# Patient Record
Sex: Female | Born: 1975 | Race: Black or African American | Hispanic: No | Smoking: Never smoker
Health system: Southern US, Community
[De-identification: ages and names within clinical notes are randomized; demographics above are authoritative.]

## PROBLEM LIST (undated history)

## (undated) DIAGNOSIS — J45909 Unspecified asthma, uncomplicated: Secondary | ICD-10-CM

## (undated) DIAGNOSIS — M1990 Unspecified osteoarthritis, unspecified site: Secondary | ICD-10-CM

## (undated) DIAGNOSIS — Z8739 Personal history of other diseases of the musculoskeletal system and connective tissue: Secondary | ICD-10-CM

## (undated) HISTORY — PX: WRIST SURGERY: SHX841

## (undated) HISTORY — PX: ABDOMINAL HYSTERECTOMY: SHX81

---

## 2003-04-12 ENCOUNTER — Emergency Department (HOSPITAL_COMMUNITY): Admission: EM | Admit: 2003-04-12 | Discharge: 2003-04-12 | Payer: Self-pay | Admitting: Emergency Medicine

## 2007-05-26 ENCOUNTER — Emergency Department (HOSPITAL_COMMUNITY): Admission: EM | Admit: 2007-05-26 | Discharge: 2007-05-26 | Payer: Self-pay | Admitting: Emergency Medicine

## 2007-07-27 ENCOUNTER — Emergency Department (HOSPITAL_COMMUNITY): Admission: EM | Admit: 2007-07-27 | Discharge: 2007-07-27 | Payer: Self-pay | Admitting: Family Medicine

## 2007-09-18 ENCOUNTER — Emergency Department (HOSPITAL_COMMUNITY): Admission: EM | Admit: 2007-09-18 | Discharge: 2007-09-19 | Payer: Self-pay | Admitting: Emergency Medicine

## 2010-03-10 ENCOUNTER — Encounter: Payer: Self-pay | Admitting: Obstetrics & Gynecology

## 2010-11-14 LAB — POCT PREGNANCY, URINE
Operator id: 247071
Preg Test, Ur: NEGATIVE

## 2014-01-11 DIAGNOSIS — N809 Endometriosis, unspecified: Secondary | ICD-10-CM | POA: Insufficient documentation

## 2014-01-11 DIAGNOSIS — M545 Low back pain, unspecified: Secondary | ICD-10-CM | POA: Insufficient documentation

## 2014-01-11 DIAGNOSIS — J4520 Mild intermittent asthma, uncomplicated: Secondary | ICD-10-CM | POA: Insufficient documentation

## 2014-01-11 DIAGNOSIS — N920 Excessive and frequent menstruation with regular cycle: Secondary | ICD-10-CM | POA: Insufficient documentation

## 2014-01-11 DIAGNOSIS — J3089 Other allergic rhinitis: Secondary | ICD-10-CM | POA: Insufficient documentation

## 2014-01-11 DIAGNOSIS — M0520 Rheumatoid vasculitis with rheumatoid arthritis of unspecified site: Secondary | ICD-10-CM | POA: Insufficient documentation

## 2014-01-11 DIAGNOSIS — K219 Gastro-esophageal reflux disease without esophagitis: Secondary | ICD-10-CM | POA: Insufficient documentation

## 2014-10-06 ENCOUNTER — Encounter (HOSPITAL_COMMUNITY): Payer: Self-pay | Admitting: Emergency Medicine

## 2014-10-06 ENCOUNTER — Emergency Department (HOSPITAL_COMMUNITY)
Admission: EM | Admit: 2014-10-06 | Discharge: 2014-10-07 | Disposition: A | Discharge status: Home / Self Care | Payer: Managed Care, Other (non HMO) | Attending: Emergency Medicine | Admitting: Emergency Medicine

## 2014-10-06 DIAGNOSIS — R42 Dizziness and giddiness: Secondary | ICD-10-CM | POA: Insufficient documentation

## 2014-10-06 DIAGNOSIS — R112 Nausea with vomiting, unspecified: Secondary | ICD-10-CM | POA: Diagnosis not present

## 2014-10-06 DIAGNOSIS — Z7951 Long term (current) use of inhaled steroids: Secondary | ICD-10-CM | POA: Insufficient documentation

## 2014-10-06 DIAGNOSIS — R197 Diarrhea, unspecified: Secondary | ICD-10-CM | POA: Insufficient documentation

## 2014-10-06 DIAGNOSIS — M1990 Unspecified osteoarthritis, unspecified site: Secondary | ICD-10-CM | POA: Insufficient documentation

## 2014-10-06 DIAGNOSIS — Z79899 Other long term (current) drug therapy: Secondary | ICD-10-CM | POA: Diagnosis not present

## 2014-10-06 DIAGNOSIS — J45909 Unspecified asthma, uncomplicated: Secondary | ICD-10-CM | POA: Insufficient documentation

## 2014-10-06 HISTORY — DX: Unspecified asthma, uncomplicated: J45.909

## 2014-10-06 HISTORY — DX: Personal history of other diseases of the musculoskeletal system and connective tissue: Z87.39

## 2014-10-06 HISTORY — DX: Unspecified osteoarthritis, unspecified site: M19.90

## 2014-10-06 LAB — CBC
HEMATOCRIT: 39.2 % (ref 36.0–46.0)
HEMOGLOBIN: 13.1 g/dL (ref 12.0–15.0)
MCH: 28.4 pg (ref 26.0–34.0)
MCHC: 33.4 g/dL (ref 30.0–36.0)
MCV: 85 fL (ref 78.0–100.0)
Platelets: 566 10*3/uL — ABNORMAL HIGH (ref 150–400)
RBC: 4.61 MIL/uL (ref 3.87–5.11)
RDW: 14.7 % (ref 11.5–15.5)
WBC: 9.1 10*3/uL (ref 4.0–10.5)

## 2014-10-06 MED ORDER — ONDANSETRON 4 MG PO TBDP
4.0000 mg | ORAL_TABLET | Freq: Once | ORAL | Status: AC | PRN
  Administered 2014-10-06: 4 mg via ORAL
  Filled 2014-10-06: qty 1

## 2014-10-06 NOTE — ED Notes (Addendum)
Pt from home c/o dizziness and nausea yesterday morning. She reports stopping all her home meds in hopes that was the cause of her dizziness. She reports the dizziness "feels as is the room is spinning". Pt ambulated to triage with cane and steady gait. She reports taking pepto bismal witout relief.

## 2014-10-07 ENCOUNTER — Emergency Department (HOSPITAL_COMMUNITY): Payer: Managed Care, Other (non HMO)

## 2014-10-07 DIAGNOSIS — R112 Nausea with vomiting, unspecified: Secondary | ICD-10-CM | POA: Diagnosis not present

## 2014-10-07 LAB — COMPREHENSIVE METABOLIC PANEL
ALT: 18 U/L (ref 14–54)
ANION GAP: 9 (ref 5–15)
AST: 22 U/L (ref 15–41)
Albumin: 4.4 g/dL (ref 3.5–5.0)
Alkaline Phosphatase: 58 U/L (ref 38–126)
BUN: 8 mg/dL (ref 6–20)
CHLORIDE: 99 mmol/L — AB (ref 101–111)
CO2: 29 mmol/L (ref 22–32)
Calcium: 9.7 mg/dL (ref 8.9–10.3)
Creatinine, Ser: 0.97 mg/dL (ref 0.44–1.00)
GFR calc non Af Amer: >60 mL/min (ref >60)
Glucose, Bld: 89 mg/dL (ref 65–99)
POTASSIUM: 3.8 mmol/L (ref 3.5–5.1)
SODIUM: 137 mmol/L (ref 135–145)
Total Bilirubin: 0.5 mg/dL (ref 0.3–1.2)
Total Protein: 8.6 g/dL — ABNORMAL HIGH (ref 6.5–8.1)

## 2014-10-07 LAB — LIPASE, BLOOD: Lipase: 33 U/L (ref 22–51)

## 2014-10-07 MED ORDER — ONDANSETRON 4 MG PO TBDP: 4.0000 mg | ORAL_TABLET | Freq: Once | ORAL | Status: DC | PRN

## 2014-10-07 MED ORDER — SODIUM CHLORIDE 0.9 % IV BOLUS (SEPSIS)
1000.0000 mL | Freq: Once | INTRAVENOUS | Status: AC
  Administered 2014-10-07: 1000 mL via INTRAVENOUS

## 2014-10-07 NOTE — ED Notes (Signed)
Pt is unable to urinate at this time. 

## 2014-10-07 NOTE — Discharge Instructions (Signed)
follow up with your PCP  Try to eat a more bland diet for the next several days

## 2014-10-07 NOTE — ED Provider Notes (Addendum)
CSN: [id number]     Arrival date & time 10/06/14  2250 History   First MD Initiated Contact with Patient 10/07/14 0132     Chief Complaint  Patient presents with  . Dizziness  . Nausea     (Consider location/radiation/quality/duration/timing/severity/associated sxs/prior Treatment) Patient is a 39 y.o. female presenting with dizziness. The history is provided by the patient.  Dizziness Quality:  Head spinning Severity:  Moderate Onset quality:  Gradual Timing:  Constant Progression:  Unchanged Chronicity:  New Context: head movement   Relieved by:  Nothing Worsened by:  Nothing Ineffective treatments:  None tried Associated symptoms: diarrhea, nausea and vomiting   Associated symptoms: no chest pain, no headaches and no shortness of breath     Past Medical History  Diagnosis Date  . Asthma   . Arthritis   . H/O degenerative disc disease    Past Surgical History  Procedure Laterality Date  . Wrist surgery    . Abdominal hysterectomy     No family history on file. Social History  Substance Use Topics  . Smoking status: Never Smoker   . Smokeless tobacco: None  . Alcohol Use: Yes     Comment: social   OB History    No data available     Review of Systems  Constitutional: Negative for fever and chills.  Respiratory: Negative for shortness of breath.   Cardiovascular: Negative for chest pain.  Gastrointestinal: Positive for nausea, vomiting and diarrhea. Negative for abdominal pain.  Genitourinary: Negative for dysuria.  Skin: Negative for rash and wound.  Neurological: Positive for dizziness and light-headedness. Negative for headaches.  All other systems reviewed and are negative.     Allergies  Other and Percocet  Home Medications   Prior to Admission medications   Medication Sig Start Date End Date Taking? Authorizing Provider  albuterol (PROVENTIL HFA;VENTOLIN HFA) 108 (90 BASE) MCG/ACT inhaler Inhale 1-2 puffs into the lungs every 6 (six) hours  as needed for wheezing or shortness of breath.   Yes Historical Provider, MD  cetirizine (ZYRTEC) 10 MG tablet Take 10 mg by mouth daily.   Yes Historical Provider, MD  clonazePAM (KLONOPIN) 0.5 MG tablet Take 1 tablet by mouth daily as needed. anxiety 08/15/14  Yes Historical Provider, MD  diphenhydrAMINE (BENADRYL) 25 MG tablet Take 50 mg by mouth every 6 (six) hours as needed for itching (when she takes oxycodone).   Yes Historical Provider, MD  EPINEPHrine 0.3 mg/0.3 mL IJ SOAJ injection Inject 0.3 mg into the muscle once.   Yes Historical Provider, MD  fluticasone (FLONASE) 50 MCG/ACT nasal spray Place 1 spray into both nostrils 2 (two) times daily.   Yes Historical Provider, MD  ibuprofen (ADVIL,MOTRIN) 800 MG tablet Take 1 tablet by mouth every 8 (eight) hours as needed. pain 08/13/14  Yes Historical Provider, MD  pantoprazole (PROTONIX) 40 MG tablet Take 1 tablet by mouth daily. 09/23/14  Yes Historical Provider, MD  PARoxetine (PAXIL) 20 MG tablet Take 1 tablet by mouth daily. 08/15/14  Yes Historical Provider, MD  gabapentin (NEURONTIN) 300 MG capsule Take 1 capsule by mouth 3 (three) times daily. 09/22/14   Historical Provider, MD  ondansetron (ZOFRAN-ODT) 4 MG disintegrating tablet Take 1 tablet (4 mg total) by mouth once as needed for nausea. 10/07/14   Earley Favor, NP  oxyCODONE-acetaminophen (PERCOCET/ROXICET) 5-325 MG per tablet Take 1 tablet by mouth every 6 (six) hours as needed. pain 10/04/14   Historical Provider, MD  QVAR 80 MCG/ACT inhaler Inhale  2 puffs into the lungs 2 (two) times daily. 08/16/14   Historical Provider, MD   BP 123/84 mmHg  Pulse 92  Temp(Src) 98.3 F (36.8 C) (Oral)  Resp 20  SpO2 100%  LMP 10/06/2014 Physical Exam  Constitutional: She is oriented to person, place, and time. She appears well-developed.  HENT:  Head: Normocephalic.  Eyes: Pupils are equal, round, and reactive to light.  Neck: Normal range of motion.  Cardiovascular: Normal rate and regular  rhythm.   Pulmonary/Chest: Effort normal.  Abdominal: Soft.  Musculoskeletal: Normal range of motion.  Neurological: She is alert and oriented to person, place, and time.  Skin: Skin is warm.  Vitals reviewed.   ED Course  Procedures (including critical care time) Labs Review Labs Reviewed  COMPREHENSIVE METABOLIC PANEL - Abnormal; Notable for the following:    Chloride 99 (*)    Total Protein 8.6 (*)    All other components within normal limits  CBC - Abnormal; Notable for the following:    Platelets 566 (*)    All other components within normal limits  LIPASE, BLOOD  URINALYSIS, ROUTINE W REFLEX MICROSCOPIC (NOT AT [facility])    Imaging Review Ct Head Wo Contrast  10/07/2014   CLINICAL DATA:  Dizziness and nausea beginning 1 day ago. History of asthma.  EXAM: CT HEAD WITHOUT CONTRAST  TECHNIQUE: Contiguous axial images were obtained from the base of the skull through the vertex without intravenous contrast.  COMPARISON:  None.  FINDINGS: The ventricles and sulci are normal. No intraparenchymal hemorrhage, mass effect nor midline shift. No acute large vascular territory infarcts. Cerebellar tonsils at but not below the foramen magnum.  No abnormal extra-axial fluid collections. Basal cisterns are patent.  No skull fracture. The included ocular globes and orbital contents are non-suspicious. Fluid-filled, mildly expanded sella. The mastoid aircells and included paranasal sinuses are well-aerated.  IMPRESSION: No acute intracranial process.  CT findings of empty sella.   Electronically Signed   By: Awilda Metro M.D.   On: 10/07/2014 02:55   I have personally reviewed and evaluated these images and lab results as part of my medical decision-making.   EKG Interpretation None      MDM   Final diagnoses:  Nausea vomiting and diarrhea  Dizzy         Earley Favor, NP 10/07/14 0435  Cy Blamer, MD 10/07/14 0441  Earley Favor, NP 11/01/14 2001  April Palumbo,  MD 11/01/14 2309

## 2014-10-07 NOTE — ED Notes (Signed)
Pt able to ambulate without assistance or difficulty. Pt with steady gait. No complaints of dizziness or lightheadedness.

## 2014-10-20 ENCOUNTER — Other Ambulatory Visit: Payer: Self-pay | Admitting: Specialist

## 2014-10-20 DIAGNOSIS — M5416 Radiculopathy, lumbar region: Secondary | ICD-10-CM

## 2014-10-24 ENCOUNTER — Other Ambulatory Visit: Payer: Self-pay | Admitting: Specialist

## 2014-10-24 ENCOUNTER — Ambulatory Visit
Admission: RE | Admit: 2014-10-24 | Discharge: 2014-10-24 | Disposition: A | Discharge status: Home / Self Care | Payer: Managed Care, Other (non HMO) | Source: Ambulatory Visit | Attending: Specialist | Admitting: Specialist

## 2014-10-24 ENCOUNTER — Ambulatory Visit
Admission: RE | Admit: 2014-10-24 | Discharge: 2014-10-24 | Disposition: A | Discharge status: Home / Self Care | Source: Ambulatory Visit | Attending: Specialist | Admitting: Specialist

## 2014-10-24 ENCOUNTER — Inpatient Hospital Stay
Admission: RE | Admit: 2014-10-24 | Discharge: 2014-10-24 | Disposition: A | Discharge status: Home / Self Care | Payer: Self-pay | Source: Ambulatory Visit | Attending: Specialist | Admitting: Specialist

## 2014-10-24 DIAGNOSIS — M5416 Radiculopathy, lumbar region: Secondary | ICD-10-CM

## 2014-10-24 DIAGNOSIS — R52 Pain, unspecified: Secondary | ICD-10-CM

## 2014-10-24 MED ORDER — IOHEXOL 180 MG/ML  SOLN
15.0000 mL | Freq: Once | INTRAMUSCULAR | Status: DC | PRN
  Administered 2014-10-24: 15 mL via INTRATHECAL

## 2014-10-24 MED ORDER — DIAZEPAM 5 MG PO TABS
10.0000 mg | ORAL_TABLET | Freq: Once | ORAL | Status: AC
  Administered 2014-10-24: 10 mg via ORAL

## 2014-10-24 NOTE — Discharge Instructions (Signed)
Myelogram Discharge Instructions  1. Go home and rest quietly for the next 24 hours.  It is important to lie flat for the next 24 hours.  Get up only to go to the restroom.  You may lie in the bed or on a couch on your back, your stomach, your left side or your right side.  You may have one pillow under your head.  You may have pillows between your knees while you are on your side or under your knees while you are on your back.  2. DO NOT drive today.  Recline the seat as far back as it will go, while still wearing your seat belt, on the way home.  3. You may get up to go to the bathroom as needed.  You may sit up for 10 minutes to eat.  You may resume your normal diet and medications unless otherwise indicated.  Drink plenty of extra fluids today and tomorrow.  4. The incidence of a spinal headache with nausea and/or vomiting is about 5% (one in 20 patients).  If you develop a headache, lie flat and drink plenty of fluids until the headache goes away.  Caffeinated beverages may be helpful.  If you develop severe nausea and vomiting or a headache that does not go away with flat bed rest, call [phone].  5. You may resume normal activities after your 24 hours of bed rest is over; however, do not exert yourself strongly or do any heavy lifting tomorrow.  6. Call your physician for a follow-up appointment.   You may resume Paroxetine/Paxil on Wednesday, October 25, 2014 after 1:00p.m.

## 2015-05-10 NOTE — H&P (Signed)
History and Physical    [facility]  Lexia Vandevender G. Jovane Foutz, MD  Admission Date: 05/10/2015    REASON FOR HOSPITALIZATION:  Symptomatic fibroid uterus for  hysterectomy.    HISTORY OF PRESENT ILLNESS:  This patient is a 40 year old black  female with a history for a symptomatic fibroid uterus which has been  unresponsive to medical therapy.  She is being admitted for  laparoscopic-assisted vaginal hysterectomy for definitive treatment.    PAST MEDICAL HISTORY:  Some arthritis, seasonal allergies and eczema.    PAST SURGICAL HISTORY:  Significant for previous cesarean section x1.   She has had a normal Pap smear, but did have positive high risk HPV,  so she is advised to have a complete hysterectomy.    ALLERGIES:  She has no known allergies.    MEDICATIONS:  She is on multiple medications which are listed in her  chart.    PHYSICAL EXAMINATION:  VITAL SIGNS:  She is afebrile with stable vital  signs.  GENERAL:  She is alert and oriented.  HEENT:  Unremarkable.   CHEST:  Clear to auscultation.  HEART:   Regular rate and rhythm.   ABDOMEN:  Soft and nontender.  PELVIC:  Reveals normal external  genitalia with normal vagina and cervix.  Bimanual exam confirms a  small fibroid uterus and no adnexal masses noted.  EXTREMITIES:   Unremarkable.    IMPRESSION:  Symptomatic fibroid uterus.      PLAN:  Laparoscopic-assisted vaginal hysterectomy.      Reece Packerebecca G Yasmina [place], MD  TR: *n DD: 05/10/2015 08:10 TD: 05/10/2015 08:27 Job#: [id number]  \\[id number]\\DOC#: [id number]  \\[id number]\\  Signature Line    Electronically Signed on 05/10/2015 04:45 PM EDT  ________________________________________________  Lissa MoralesBAIRD-MD,  Lawayne Hartig G

## 2015-05-10 NOTE — Procedures (Signed)
IntraOp Record - [facility]             IntraOp Record - [facility] Summary                                                                   Primary Physician:        Lissa Morales    Case Number:              [id number]    Finalized Date/Time:      05/10/15 10:21:21    Pt. Name:                 Kayla Lucas, Kayla Lucas    D.O.B./Sex:               09/21/1975    Female    Med Rec #:                [id number]    Physician:                Lissa Morales    Financial #:              [id number]    Pt. Type:                 R    Room/Bed:                 /    Admit/Disch:              05/10/15 05:43:00 -    Institution:       [facility] - Case Times                                                                                                         Entry 1                                                                                                          Patient      In Room Time             05/10/15 08:10:00               Out Room Time                   05/10/15 10:09:00    Anesthesia     Procedure  Start Time               05/10/15 08:32:00               Stop Time                       05/10/15 10:02:00    Last Modified By:         Billey Chang RN,                              PATRICIA 05/10/15                              10:13:02      SFOR - Case Times Audit                                                                          05/10/15 10:13:02         Owner: Clovis Fredrickson                               Modifier: NETHPA                                                        <+> 1         Out Room Time        <+> 1         Stop Time        SFOR - Safety Checklist - Sign In                                                               Pre-Care Text:            A.10 Confirms patient identity A.20 Verifies operative procedure, surgical site, and laterality A.20.1 Verifies           consent for planned procedure A.30 Verifies allergies                              Entry 1  History/Physical on       Yes                             Procedure Consent               Yes    Chart                                                     on Chart     Site Marked (if           Yes    applicable)     Last Modified By:         Billey Chang, RN,                              PATRICIA 05/10/15                              09:13:28    Post-Care Text:            E.30 Evaluates verification process for correct patient, site, side, and level surgery      SFOR - Case Attendance                                                                                                    Entry 1                         Entry 2                         Entry 3                                          Case Attendee             BAIRD-MD,  REBECCA Durwin Nora,  STEPHEN O    Role Performed            Surgeon Primary                 Surgeon Secondary               Anesthesiologist    Time In                   05/10/15 08:10:00               05/10/15 08:10:00               05/10/15 08:10:00  Time Out                  05/10/15 10:09:00               05/10/15 10:09:00               05/10/15 10:09:00    Procedure                 Hysterectomy Vaginal            Hysterectomy Vaginal            Hysterectomy Vaginal                              Laparoscopic Assist             Laparoscopic Assist             Laparoscopic Assist    Last Modified By:         Billey Chang RN,                 Billey Chang, RN,                 NETHERCOTT, RN,                              PATRICIA 05/10/15               PATRICIA 05/10/15               PATRICIA 05/10/15                              10:21:14                        10:21:14                        10:21:14                                Entry 4                         Entry 5                                                                          Case Attendee             Billey Chang, RN, PATRICIA         RHODES,  SUSAN D    Role Performed            Circulator                      Surgical Scrub    Time In                   05/10/15 08:10:00               05/10/15 08:10:00    Time Out  05/10/15 10:09:00               05/10/15 10:09:00    Procedure                 Hysterectomy Vaginal            Hysterectomy Vaginal                              Laparoscopic Assist             Laparoscopic Assist    Last Modified By:         Westly Pam, RN,                              PATRICIA 05/10/15               PATRICIA 05/10/15                              10:21:14                        10:21:14      SFOR - Case Attendance Audit                                                                     05/10/15 10:21:14         Owner: Clovis Fredrickson                               Modifier: NETHPA                                                            1     <+> Time Out            1     <*> Procedure                              Hysterectomy Vaginal Laparoscopic Assist            2     <+> Time Out            2     <*> Procedure                              Hysterectomy Vaginal Laparoscopic Assist            3     <+> Time Out            3     <*> Procedure  Hysterectomy Vaginal Laparoscopic Assist            4     <+> Time Out            4     <*> Procedure                              Hysterectomy Vaginal Laparoscopic Assist            5     <+> Time Out            5     <*> Procedure                              Hysterectomy Vaginal Laparoscopic Assist     05/10/15 09:13:49         Owner: NETHPA                               Modifier: NETHPA                                                            1     <*> Procedure                              Hysterectomy Vaginal Laparoscopic Assist            2     <+> Time In            2     <*> Procedure                              Hysterectomy Vaginal Laparoscopic Assist            3     <+> Time In            3      <*> Procedure                              Hysterectomy Vaginal Laparoscopic Assist            4     <+> Time In            4     <*> Procedure                              Hysterectomy Vaginal Laparoscopic Assist            5     <+> Time In            5     <*> Procedure                              Hysterectomy Vaginal Laparoscopic Assist     05/10/15 08:58:28         Owner: Clovis Fredrickson  Modifier: NETHPA                                                        <+> 1         Time In        <+> 1         Procedure        <+> 2         Case Attendee        <+> 2         Role Performed        <+> 2         Procedure        <+> 3         Case Attendee        <+> 3         Role Performed        <+> 3         Procedure        <+> 4         Case Attendee        <+> 4         Role Performed        <+> 4         Procedure        <+> 5         Case Attendee        <+> 5         Role Performed        <+> 5         Procedure        SFOR - Skin Assessment                                                                          Pre-Care Text:            A.240 Assesses baseline skin condition Im.120 Implements protective measures to prevent skin or tissue injury           due to mechanical sources  Im.280.1 Implements progective measures to prevent skin or tissue injury due to           thermal sources Im.360 Monitors for signs and symptons of infection                              Entry 1  Skin Integrity            Intact    Last Modified By:         Billey ChangNETHERCOTT, RN,                              PATRICIA 05/10/15                              08:58:33    Post-Care Text:            E.10 Evaluates for signs and symptoms of physical injury to skin and tissue E.270 Evaluate tissue perfusion           O.60 Patient is free from signs and symptoms of injury caused by extraneous objects   O.210 Patinet's tissue            perfusion is consistent with or improved from baseline levels      SFOR - Patient Positioning                                                                      Pre-Care Text:            A.240 Assesses baseline skin condition A.280 Identifies baseline musculoskeletal status A.280.1 Identifies           physical alterations that require additional precautions for procedure-specific positioning A.510.8 Maintains           patient's dignity and privacy Im.120 Implements protective measures to prevent skin/tissue injury due to           mechanical sources Im.40 Positions the patient Im.80 Applies safety devices                              Entry 1                                                                                                          Procedure                 Hysterectomy Vaginal            Body Position                   Lithotomy                              Laparoscopic Assist    Left Arm Position         Tucked and Padded at            Right Arm Position  Tucked and Padded at                              Side                                                            Side    Left Leg Position         Stirrup Leg Support             Right Leg Position              Stirrup Leg Support                              Lift Assist Secured In                                          Therapist, art Secured In    Feet Uncrossed            Yes                             Pressure Points                 Yes                                                              Checked     Additional                [place] non slip           Positioning Device              Head Rest Donut, Foam    Information               pad on table                                                    Padding    Positioned By             NETHERCOTT, RN,                 Outcome Met (O.80)              Yes                              PATRICIA, BAIRD-MD,                              REBECCA G, SLATTERY-MD,  STEPHEN O    Last Modified By:         Billey Chang, RN,                              PATRICIA 05/10/15                              09:00:15    Post-Care Text:            E.10 Evaluates for signs and symptoms of physical injury to skin and tissue E.290 Evaluates musculoskeletal           status O.80 Patient is free from signs and symptoms of injury related to positioning O.120 the patient is free           from signs and symptoms of injury related to transfer/transport  O.250 Patient's musculoskeletal status is           maintained at or improved from baseline levels      SFOR - Skin Prep                                                                                Pre-Care Text:            A.30 Verifies allergies A.20 Verifies procedure, surgical site, and laterality A.510.8 Maintains paritnet's           dignity and privacy Im.270 Performs Skin Preparation Im.270.1 Implements protective measures to prevent skin           and tissue injury due to chemical sources  A.300.1 Protects from cross-contamination                              Entry 1                         Entry 2                                                                          Hair Removal      Hair Removal By      Hair Removal Methods      Hair Removal Site      Hair Removal Site      Details     Skin Prep      Prep Agents (Im.270)     Chlorhexidine Gluconate         Chlorhexidine Gluconate                              2% w/Alcohol                    4%     Prep Area (Im.270)  Abdomen                         Labia/Perineum/Urethra,                                                              Vagina     Prep Area Details      Prep By                  BAIRD-MD,  REBECCA G            BAIRD-MD,  REBECCA G    Outcome Met (O.100)       Yes                             Yes    Last Modified By:         Billey Chang, RN,                 NETHERCOTT, RN,                              PATRICIA 05/10/15               PATRICIA 05/10/15                               09:05:06                        09:05:06    Post-Care Text:            E.10 Evaluates for signs and symptoms of physical injury to skin and tissue O.100 Patient is free from signs           and symptoms of chemical injury  O.740 The patient's right to privacy is maintained      SFOR - Skin Prep Audit                                                                           05/10/15 09:05:06         Owner: NETHPA                               Modifier: NETHPA                                                            1     <*> Prep Area (Im.270)                     Abdomen  1     <*> Prep Agents (Im.270)                   Chlorhexidine Gluconate 2% w/Alcohol            1     <*> Prep By                                BAIRD-MD,  REBECCA G            1     <*> Outcome Met (O.100)                    Yes            2     <*> Prep Area (Im.270)                     Labia/Perineum/Urethra, Vagina            2     <*> Prep Agents (Im.270)                   Chlorhexidine Gluconate 4%            2     <*> Prep By                                BAIRD-MD,  REBECCA G            2     <*> Outcome Met (O.100)                    Yes        Entry 3 was deleted.  Higher numbered entries shifted one position to fill the gap.        <-> 3         Outcome Met (O.100)                    Yes        SFOR - Counts Initial and Final                                                                 Pre-Care Text:            A.20 Verifies operative procedure, sugical site, and laterality A.20.2 Assesses the risk for unintended           retained foreign body Im.20 Performs required counts                              Entry 1  Initial Counts      Initial Counts           RHODES,  SUSAN D,               Items included in               Instruments, Sponges,     Performed By             Billey Chang, RN, PATRICIA         the Initial Count               Sharps    Final Counts      Final Counts             RHODES,  SUSAN D,               Final Count Status              Correct     Performed By             Billey Chang, RN, PATRICIA     Items Included in        Instruments, Sponges,     Final Count              Sharps    Outcome Met (O.20)        Yes    Last Modified By:         Billey Chang, RN,                              PATRICIA 05/10/15                              10:19:26    Post-Care Text:            E.50 Evaluates results of the surgical count O.20 Patient is free from unintended retained foreign objects      SFOR - Counts Initial and Final Audit                                                            05/10/15 10:19:26         Owner: NETHPA                               Modifier: NETHPA                                                            1     <*> Items Included in Final Count          Sponges, Sharps     05/10/15 10:18:28         Owner: NETHPA                               Modifier: NETHPA                                                        <+>  1         Final Counts Performed By        <+> 1         Final Count Status        <+> 1         Outcome Met (O.20)        SFOR - Counts Additional                                                                        Pre-Care Text:            A.20 Verifies operative procedure, sugical site, and laterality A.20.2 Assesses the risk for unintended           retained foreign body Im.20 Performs required counts                              Entry 1                                                                                                          Additional Count          Closing Count                   Additional Count                RHODES,  SUSAN D,    Type                                                      Participants                    NETHERCOTT, RN, PATRICIA    Count Status              Correct                         Items Counted                   Instruments,  Sponges,  Sharps    Outcome Met (O.20)        Yes    Last Modified By:         Billey Chang, RN,                              PATRICIA 05/10/15                              10:19:39    Post-Care Text:            E.50 Evaluates results of the surgical count O.20 Patient is free from unintended retained foreign objects      SFOR - Counts Additional Audit                                                                   05/10/15 10:19:39         Owner: NETHPA                               Modifier: NETHPA                                                            1     <*> Items Counted                          Sponges, Sharps     05/10/15 10:19:04         Owner: NETHPA                               Modifier: NETHPA                                                        <+> 1         Additional Count Participants        <+> 1         Count Status        <+> 1         Outcome Met (O.20)        SFOR - General Case Data                                                                        Pre-Care Text:            A.350.1 Classifies surgical wound  Entry 1                                                                                                          Case Information      ASA Class                2                               Case Level                      Level 3     OR                       SF 02                           Specialty                       Gynecology and                                                                                              Obstetrics (SN)     Wound Class              2-Clean-Contaminated    Preop Diagnosis           SYMPTOMATIC FIBRIOD                              UTERUS    Last Modified By:         Billey Chang, RN,                              PATRICIA 05/10/15                              09:06:04    Post-Care Text:            O.760 Patient receives  consistent and comparable care regardless of the setting      SFOR - Fire Risk Assessment  Entry 1                                                                                                          Fire Risk                 Alcohol Based Prep              Fire Risk Score                 2    Assessment: If            Solution, Ignition    checked, checkmark        Source In Use    = 1 point     Last Modified By:         Billey Chang, RN,                              PATRICIA 05/10/15                              09:06:18      SFOR - Safety Checklist - Time Out                                                              Pre-Care Text:            A.10 Confirms patient identity A.20 Verifies operative procedure, surgical site, and laterality A.20.1 Verifies           consent for planned procedure A.30 Verifies allergies                              Entry 1                                                                                                          Surgical/Procedure        Yes                             Time Out Complete               05/10/15 08:31:00    Team confirms     correct patient,  correct site and     correct procedure     Last Modified By:         Billey Chang, RN,                              PATRICIA 05/10/15                              09:06:48    Post-Care Text:            E.30 Evaluates verification process for correct patient, site, side, and level surgery      SFOR - Cautery                                                                                  Pre-Care Text:            A.240 Assesses baseline skin condition A280.1 Identifies baseline musculoskeletal status Im.50 Implements           protective measures to prevent injury due to electrical sources  Im.60 Uses supplies and equipment within safe           parameters Im.80 Applies safety devices                              Entry 1                                                                                                           ESU Type                  GENERATOR                       Identification                  [id number]                              COVIDIEN/VALLEYLAB              Number     Coag Setting (watts)      35                              Cut Setting (watts)             35    Grounding Pad             Yes  Grounding Pad Site              Thigh, left    Needed?     Grounding Pad             NETHERCOTT, RN, PATRICIA        Outcome Met (O.10)              Yes    Applied By     Last Modified By:         Billey Chang, RN,                              PATRICIA 05/10/15                              10:18:03    Post-Care Text:            E.10 Evaluates for signs and symptoms of physical injury to skin and tissue O.10 Patient is free from signs and           symptoms of injury related to thermal sources  O.70 Patient is free from signs and symptoms of electrical injury      SFOR - Cautery Audit                                                                             05/10/15 10:18:03         Owner: NETHPA                               Modifier: NETHPA                                                            1     <*> ESU Type                               GENERATOR COVIDIEN/VALLEYLAB            1     <*> Identification Number                  [id number]            1     <*> Grounding Pad Needed?                  Yes            1     <*> Grounding Pad Site                     Thigh, left            1     <*> Outcome Met (O.10)  Yes            1     <*> Grounding Pad Applied By               [employer], RN, PATRICIA            1     <*> Cut Setting (watts)                    35            1     <*> Coag Setting (watts)                   35        Entry 2 was deleted.  Higher numbered entries shifted one position to fill the gap.        <-> 2         ESU Type                               SEALER  LIGASURE LAPAROSCOPIC        <-> 2         Identification Number                  [id number]        <-> 2         Outcome Met (O.10)                     Yes        SFOR - Patient Care Devices                                                                     Pre-Care Text:            A.200 Assesses risk for normothermia regulation A.40 Verifies presence of prosthetics or corrective devices           Im.280 Implements thermoregulation measures Im.60 Uses supplies and equipment within safe parameters                              Entry 1                         Entry 2                                                                          Equipment Type            MACHINE SEQUENTIAL              BAIR HUGGER                              COMPRESSION    SCD Sleeve Site  Legs Bilateral    Equipment/Tag Number      [id number]                          [id number]    Initiated Pre             Yes    Induction     Last Modified By:         Billey Chang RN,                 NETHERCOTT, RN,                              PATRICIA 05/10/15               PATRICIA 05/10/15                              09:11:28                        09:11:28    Post-Care Text:            E.10 Evaluates signs and symptoms of physical injury to skin and tissue O.60 Patient is free from signs and           symptoms of injury caused by extraneous objects      SFOR - Medications                                                                              Pre-Care Text:            A.10 Confirms patient identity A.30 Verifies allergies Im.220 Administers prescribed medications Im.220.2           Administers prescribed antibiotic therapy as ordered                              Entry 1                                                                                                          Time Administered         05/10/15 09:58:00               Medication                      BUPIVACAINE HCL 0.5%  INJECTION 0.5%    Route of Admin            Local Injection                 Dose/Volume                                                                                  (include amount and                                                               unit of measure)     Site                      Abdomen                         Administered By                 BAIRD-MD,  REBECCA G    Outcome Met (O.130)       Yes    Last Modified By:         Billey Chang, RN,                              PATRICIA 05/10/15                              10:17:38    Post-Care Text:            E.20 Evaluates response to medications O.130 Patient receives appropriately administerd medication(s)      SFOR - Medications Audit                                                                         05/10/15 10:17:38         Owner: NETHPA                               Modifier: NETHPA                                                            1     <*> Medication                             BUPIVACAINE HCL  0.5% INJECTION 0.5%            1     <+> Dose/Volume (include amount and                      unit of measure)            1     <+> Time Administered        SFOR - Specimens                                                                                                          Entry 1                                                                                                          Description               CERVIX, UTERUS,                 Specimen Type                   Routine                              BILATERAL TUBES    Last Modified By:         Billey Chang, RN,                              PATRICIA 05/10/15                              09:12:49      [facility] - Dressing/Packing                                                                         Pre-Care Text:            A.350 Assesses susceptibility for infection Im.250 Administers care to invasive devices Im.290 Administer care            to wound sites  Im.300 Implements aseptic technique  Entry 1                         Entry 2                         Entry 3                                          Site                      Abdomen                         Perineum                        Perineum    Site Details     Dressing Item     Details      Packing (Im.290)      Dressing Item            Band-Aid                        Peripad                         Peripad     (Im.290)      Miscellaneous      (Im.290)      Cast/Splint (Im.290)     Last Modified By:         Billey Chang RN,                 NETHERCOTT, RN,                 NETHERCOTT, RN,                              PATRICIA 05/10/15               PATRICIA 05/10/15               PATRICIA 05/10/15                              10:16:24                        10:16:40                        10:16:55    Post-Care Text:            E.320 Evaluate factors associted with increased risk for postoperative infection at the completion of the           procedure O.200 Patient's wound perfusion is consistent with or improved from baseline levels  O.Patient is           free from signs and symptoms of infection      [facility] - Dressing/Packing Audit  05/10/15 10:16:55         Owner: Clovis Fredrickson                               Modifier: [email or web address]                                                        <+> 3         Site        <+> 3         Dressing Item (Im.290)     05/10/15 10:16:40         Owner: NETHPA                               Modifier: NETHPA                                                        <+> 2         Site        <+> 2         Dressing Item (Im.290)        [facility] - Procedures                                                                               Pre-Care Text:            A.20 Verifies operative procedure, surgical site, and laterality Im.150 Develops individualized plan of care                               Entry 1                                                                                                          Procedure     Description      Procedure                Hysterectomy Vaginal            Surgical Procedure              LAVH  Laparoscopic Assisted           Text                               SCIP    Primary Procedure         Yes                             Primary Surgeon                 Lissa Morales    Start                     05/10/15 08:32:00               Stop                            05/10/15 10:02:00    Anesthesia Type           General                         Surgical Service                Gynecology and                                                                                              Obstetrics (SN)    Wound Class               2-Clean-Contaminated    Last Modified By:         Billey Chang RN,                              PATRICIA 05/10/15                              10:20:18    Post-Care Text:            O.730 The patient's care is consistent with the individualized perioperative plan of care      [facility] - Procedures Audit                                                                          05/10/15 10:20:18         Owner: NETHPA                               Modifier: NETHPA                                                        <+>  1         Stop        S[facility] - Safety Checklist - Sign Out                                                              Pre-Care Text:            Im.330 Manages specimen handling and disposition                              Entry 1                                                                                                          Patient Safety            Yes    Communication Guide     Used Throughout Case     Last Modified By:         Billey Chang, RN,                              PATRICIA 05/10/15                              10:20:26    Post-Care Text:            E.800 Ensures continuity of care E.50  Evaluates results of the surgical count O.30 Patient's procedure is           performed on the correct site, side, and level O.50 patient's current status is communicated throughout the           continuum of care O.40 Patient's specimen(s) is managed in the appropriate manner      [facility] - Transfer                                                                                                           Entry 1  Transferred By            Reita May           Via                             Stretcher                              Charlynne Pander, RN,                              PATRICIA    Post-op Destination       PACU    Skin Assessment      Condition                Intact    Last Modified By:         Billey Chang RN,                              PATRICIA 05/10/15                              10:20:41      Case Comments                                                                                         <None>              Finalized By: Billey Chang RN, PATRICIA      Document Signatures                                                                             Signed By:           Billey Chang RN, PATRICIA 05/10/15 10:21

## 2015-05-10 NOTE — Nursing Note (Signed)
Medication Administration Follow Up-Text       Medication Administration Follow Up Entered On:  05/10/2015 10:38 EDT    Performed On:  05/10/2015 10:37 EDT by Braxton Feathers, RN, Suzan Slick      Intervention Information:     hydromorphone  Performed by [employer], RN, Suzan Slick on 05/10/2015 10:24:00 EDT       hydromorphone,0.2mg   IV Push,Antecubital, Left,other (see comment)       Medication Effectiveness Evaluation   Medication Administration Reason :   Pain   Medication Effective :   Yes   [employer], RN, Suzan Slick - 05/10/2015 10:37 EDT

## 2015-05-10 NOTE — Op Note (Signed)
Operative Report    St. Levander Campion, MD  Service Date: 05/10/2015    PREOPERATIVE DIAGNOSIS:  Symptomatic fibroid uterus.            POSTOPERATIVE DIAGNOSIS:  Symptomatic fibroid uterus.            PROCEDURE:  Laparoscopic-assisted vaginal hysterectomy with lysis of  adhesions.            ANESTHESIA:  General endotracheal.            SURGEON:  Dr. Francoise Schaumann          ASSISTANT:  Dr. Rosary Lively          FINDINGS:  At the time of surgery, the patient was noted to have a  large bulky fibroid uterus with multiple fibroids noted.  Uterus was  approximately 12 weeks in size.  She had normal fallopian tubes and  ovaries bilaterally.  She also had some adhesions in the right lower  quadrant involving her colon to the sidewall and to a portion of the  anterior aspect of the uterus which were carefully lysed during her  surgery.  The remainder of her pelvis was normal in appearance.            SURGICAL TECHNIQUE:  The patient was taken to the operating room,  placed on the table in the dorsal supine position.  After induction of  general endotracheal anesthesia, she was placed in the dorsal  lithotomy position and prepped and draped in the usual manner for  laparoscopic procedure.  A weighted speculum was placed in her vagina,  and a single-tooth tenaculum was used to grasp the anterior and  posterior lips of the cervix.  Uterine sound was placed and secured in  the usual manner with sterile rubber bands.  A sterile rubber catheter  was used to drain the bladder.  This was secured with a hemostat.   Attention was then turned to the abdomen where an umbilical skin  incision was made with a scalpel blade.  The Veress needle was  inserted into the peritoneal cavity and this was confirmed with  sterile saline.  After noting this, CO2 gas was then used to  insufflate the peritoneal cavity to approximately 15 mmHg.  The Veress  needle was then removed, and a 5-mm trocar was inserted under direct  visualization.  The  peritoneal cavity was easily entered.  A second  and third puncture site were then placed in the right and left lower  quadrant under direct visualization without complication.  The  findings as stated above were noted.  After noting these findings,  using the LigaSure, the adhesions on the right pelvic sidewall and  right aspect of the uterus were carefully cauterized and incised,  removing the adhesions from the uterus as well as from the pelvic  sidewall to improve visualization of the remaining portion of the  uterine anatomy.  After this was accomplished, the fallopian tubes  were grasped bilaterally and, using the LigaSure for dissection, were  carefully dissected to go with the uterine specimen.  The uteroovarian  ligaments were carefully cauterized bilaterally with the LigaSure and  incised in the usual manner.  Likewise, the round ligaments were  carefully cauterized with the LigaSure bilaterally and incised in the  usual manner.  The dissection was then continued through the remaining  portion of the cardinal ligaments which were carefully cauterized and  incised bilaterally.  The bladder flap was established anteriorly with  sharp and blunt dissection using LigaSure for cautery.  The dissection  was continued down the cardinal ligaments using the LigaSure for  hemostasis.  The excision was extended down to the uterosacral  ligaments.  At this point, the vaginal portion of the procedure was  performed.  The cul-de-sac was then entered sharply using curved Mayo  scissors, and a stay suture was placed in the usual manner.  Curved  Heaney clamps were then placed across the remaining portions of the  uterosacral ligaments, which were carefully clamped, incised and  suture ligated with 0 Vicryl suture.  The anterior cervical vaginal  mucosa was then sharply incised.  The bladder flap was dissected  anteriorly, and the anterior cul-de-sac was likewise entered.  The  remaining portions of the cardinal ligaments  were carefully clamped,  incised and suture ligated with 0 Vicryl suture.  At this point, the  specimen was free of all pedicles.  Due to the bulky nature of the  uterus, the uterus was carefully morcellated and passed off the field.   At this point, the vaginal cuff was carefully inspected with no  abnormal bleeding noted.  The posterior cuff was then closed in a  simple reefing fashion with a 0 Vicryl suture.  The vaginal cuff was  then closed anteriorly to posteriorly in a simple running fashion with  0 Vicryl suture.  The vagina was inspected with good hemostasis noted.   After noting this, the attention was then turned to the abdomen where  the laparoscopic instruments were reinserted and the pelvic contents  were carefully inspected with excellent hemostasis noted.  Pelvic  contents were copiously irrigated and inspected carefully with no  bleeding noted.  After noting these findings, the 5-mm trocars were  removed under direct visualization with no excess bleeding noted.  CO2  gas was allowed to be expelled from the abdomen and the umbilical  trocar was removed.  The abdominal incisions were then closed in an  interrupted fashion with 3-0 Monocryl suture.  At the end of the  procedure, all sponge and instrument counts were correct.  There were  no complications.            ESTIMATED BLOOD LOSS:  Approximately 100 mL          SPECIMENS REMOVED:  Included uterus with cervix and bilateral  fallopian tubes.      Reece Packerebecca G Maikayla Beggs, MD  TR: *n DD: 05/10/2015 10:05 TD: 05/10/2015 10:52 Job#: [id number]  \\[id number]\\DOC#: [id number]  \\[id number]\\  Signature Line    Electronically Signed on 05/10/2015 04:45 PM EDT  ________________________________________________  Lissa MoralesBAIRD-MD,  Peggye Poon G

## 2015-05-11 NOTE — Nursing Note (Signed)
Medication Administration Follow Up-Text       Medication Administration Follow Up Entered On:  05/11/2015 0:30 EDT    Performed On:  05/10/2015 22:30 EDT by Fonda KinderGREEN-CALLOWAY, RN, STEPHANIE O      Intervention Information:     acetaminophen-hydrocodone  Performed by Fonda KinderGREEN-CALLOWAY, RN, STEPHANIE O on 05/10/2015 21:30:00 EDT       HYDROcodone-acetaminophen,1tabs  Oral,other (see comment)       Medication Effectiveness Evaluation   Medication Administration Reason :   Pain   Medication Effective :   Yes   Medication Response :   Symptoms improved   GREEN-CALLOWAY, RN, STEPHANIE O - 05/11/2015 0:30 EDT

## 2015-05-11 NOTE — Nursing Note (Signed)
Medication Administration Follow Up-Text       Medication Administration Follow Up Entered On:  05/11/2015 0:30 EDT    Performed On:  05/10/2015 22:30 EDT by Fonda KinderGREEN-CALLOWAY, RN, STEPHANIE O      Intervention Information:     ibuprofen  Performed by Fonda KinderGREEN-CALLOWAY, RN, STEPHANIE O on 05/10/2015 21:30:00 EDT       ibuprofen,800mg   Oral,mild pain (1-3)       Medication Effectiveness Evaluation   Medication Administration Reason :   Pain   Medication Effective :   Yes   Medication Response :   Symptoms improved   GREEN-CALLOWAY, RN, STEPHANIE O - 05/11/2015 0:30 EDT

## 2015-05-11 NOTE — Discharge Summary (Signed)
 [facility] Clinical Summary             [facility]  Post-Acute Care Transfer Instructions  PERSON INFORMATION   Name: MELEENA, MUNROE   MRN: [id number]    FIN#: [id number]   PHYSICIANS  Admitting Physician: ELVIS ASBERRY MATSU  Attending Physician: ELVIS ASBERRY MATSU   PCP: ELVIS ASBERRY G  Discharge Diagnosis:    Comment:       PATIENT EDUCATION INFORMATION  Instructions:             < %DISINSTRUCT%>  Medication Leaflets:             < %MEDLEAFLETNAMES%>  Follow-up:             < %FOLLOWUPTABLE%>             < %SCHEDULE%>     MEDICATION LIST  Medication Reconciliation at Discharge:         Medications that have not changed  Other Medications  beclomethasone (Qvar 40 mcg/inh inhalation aerosol) 2 Puffs Inhale (breathe in) 2 times a day.  Last Dose:____________________  cetirizine (ZyrTEC 10 mg oral tablet) 1 Tabs Oral (given by mouth) Once a Day (at bedtime).  Last Dose:____________________  pantoprazole (pantoprazole 40 mg oral delayed release tablet) 1 Tabs Oral (given by mouth) once a day (in the morning).  Last Dose:____________________  PARoxetine (PARoxetine 20 mg oral tablet) 1 Tabs Oral (given by mouth) Once a Day (at bedtime).  Last Dose:____________________         Patient's Final Home Medication List Upon Discharge:          beclomethasone (Qvar 40 mcg/inh inhalation aerosol) 2 Puffs Inhale (breathe in) 2 times a day.  cetirizine (ZyrTEC 10 mg oral tablet) 1 Tabs Oral (given by mouth) Once a Day (at bedtime).  pantoprazole (pantoprazole 40 mg oral delayed release tablet) 1 Tabs Oral (given by mouth) once a day (in the morning).  PARoxetine (PARoxetine 20 mg oral tablet) 1 Tabs Oral (given by mouth) Once a Day (at bedtime).         Comment:       ORDERS         Order Name Order Details   Discharge Patient 05/11/15 9:32:00 EDT

## 2015-05-11 NOTE — Nursing Note (Signed)
Nursing Discharge Summary - Text       Nursing Discharge Summary Entered On:  05/11/2015 13:19 EDT    Performed On:  05/11/2015 13:11 EDT by Cheyenne AdasINGER, RN, Alvis LemmingsKRISTENA M               DC Information   Discharge To, Anticipated :   Home independently   MiltonRINGER, RN, Alvis LemmingsKRISTENA M - 05/11/2015 13:11 EDT   Education   Responsible Learner(s) :   No Data Available     Teaching Method :   Explanation, Printed materials, Teach-back   KevilRINGER, RN, Alvis LemmingsKRISTENA M - 05/11/2015 13:11 EDT   Post-Hospital Education Adult Grid   Activity Expectations :   Verbalizes understanding   Importance of Follow-Up Visits :   Verbalizes understanding   Pain Management :   Verbalizes understanding   Plan of Care :   Verbalizes understanding   Postoperative Instructions :   Verbalizes understanding   [facility]   RoverRINGER, RN, Alvis LemmingsKRISTENA M - 05/11/2015 13:11 EDT   Health Maintenance Education Adult Grid   Diet/Nutrition :   TEFL teacherVerbalizes understanding   RINGER, RN, Alvis LemmingsKRISTENA M - 05/11/2015 13:11 EDT   Medication Education Adult Grid   Med Generic/Brand Name, Purpose, Action :   Verbalizes understanding   Medication Precautions :   IT sales professionalVerbalizes understanding   Safety, Medication :   Verbalizes understanding   (Comment: no RX given, pt. states that the doctor gave her prescriptions before surgery.  Cheyenne Adas[RINGER, RN, KRISTENA M - 05/11/2015 13:11 EDT] )

## 2015-05-11 NOTE — Nursing Note (Signed)
Medication Administration Follow Up-Text       Medication Administration Follow Up Entered On:  05/11/2015 10:22 EDT    Performed On:  05/11/2015 10:15 EDT by Cheyenne Adas, RN, Alvis Lemmings      Intervention Information:     acetaminophen-hydrocodone  Performed by Lonell Face RN, WHITNEY R on 05/11/2015 09:15:00 EDT       HYDROcodone-acetaminophen,1tabs  Oral,other (see comment)       Medication Effectiveness Evaluation   Medication Administration Reason :   Pain   Medication Effective :   Yes   Medication Response :   Symptoms improved   RINGER, RN, Alvis Lemmings - 05/11/2015 10:22 EDT

## 2015-05-11 NOTE — Discharge Summary (Signed)
[facility] Patient Summary               [facility]  [address]  [place], [place] [address]  [phone]  Patient Discharge Instructions     Name: Kayla Lucas, Kayla Lucas  Current Date: 05/11/2015 10:11:26  DOB: 1975/07/12 MRN: [id number] FIN: [phone]  Patient Address: [address] [place]  [address]  Patient Phone: [phone]  Primary Care Provider:  Name: Lissa Morales  Phone: [phone]   Immunizations Provided:       Discharge Diagnosis:   Discharged To:   Home Treatments: TREATMENTS%>  Devices/Equipment:   Post Hospital Services: SERVICES%>  Professional Skilled Services: SKILLED SERVICES%>  Therapist, sports and Community Resources: SERVICES AND COMMUNITY RESOURCES%>  Mode of Discharge Transportation:   Discharge Orders:         Discharge Patient 05/11/15 9:32:00 EDT         Comment:      Medications   During the course of your visit, your medication list was updated with the most current information. The details of those changes are reflected below:         Medications that have not changed  Other Medications  beclomethasone (Qvar 40 mcg/inh inhalation aerosol) 2 Puffs Inhale (breathe in) 2 times a day.  Last Dose:____________________  cetirizine (ZyrTEC 10 mg oral tablet) 1 Tabs Oral (given by mouth) Once a Day (at bedtime).  Last Dose:____________________  pantoprazole (pantoprazole 40 mg oral delayed release tablet) 1 Tabs Oral (given by mouth) once a day (in the morning).  Last Dose:____________________  PARoxetine (PARoxetine 20 mg oral tablet) 1 Tabs Oral (given by mouth) Once a Day (at bedtime).  Last Dose:____________________         [facility] would like to thank you for allowing [place] to assist you with your healthcare needs. The following includes patient education materials and information regarding your injury/illness.     Kayla Lucas, Kayla Lucas has been given the following list of follow-up instructions, prescriptions, and patient education  materials:  Follow-up Instructions             With: Address: When:   BAIRD-MD, REBECCA G     [phone] In 2 weeks                       It is important to always keep an active list of medications available so that you can share with other providers and manage your medications appropriately. As an additional courtesy, we are also providing you with your final active medications list that you can keep with you.           beclomethasone (Qvar 40 mcg/inh inhalation aerosol) 2 Puffs Inhale (breathe in) 2 times a day.  cetirizine (ZyrTEC 10 mg oral tablet) 1 Tabs Oral (given by mouth) Once a Day (at bedtime).  pantoprazole (pantoprazole 40 mg oral delayed release tablet) 1 Tabs Oral (given by mouth) once a day (in the morning).  PARoxetine (PARoxetine 20 mg oral tablet) 1 Tabs Oral (given by mouth) Once a Day (at bedtime).      Take only the medications listed above. Contact your doctor prior to taking any medications not on this list.        Discharge instructions, if any, will display below     Instructions for Diet: for Diet%>   Instructions for Supplements: Instructions%>   Instructions for Activity: for Activity%>   Instructions for Wound Care:  for Wound Care%>     Medication leaflets, if any, will display below         Patient education materials, if any, will display below              IS IT A STROKE?  Act FAST and Check for these signs:     FACE                  Does the face look uneven?     ARM                    Does one arm drift down?     SPEECH             Does their speech sound strange?     TIME                   Call 9-1-1 at any sign of stroke  Heart Attack Signs  Chest discomfort: Most heart attacks involve discomfort in the center of the chest and lasts more than a few minutes, or goes away and comes back. It can feel like uncomfortable pressure, squeezing, fullness or pain.  Discomfort in upper body: Symptoms can include pain or discomfort in one or both arms, back, neck, jaw or  stomach.  Shortness of breath: With or without discomfort.  Other signs: Breaking out in a cold sweat, nausea, or lightheaded.  Remember, MINUTES DO MATTER. If you experience any of these heart attack warning signs, call 9-1-1 to get immediate medical attention!             Yes - Patient/Family/Caregiver demonstrates understanding of instructions given  ______________________________ ___________ ___________________ ___________  Patient/Family/ Caregiver Signature Date/Time          Provider Signature Date/Time

## 2016-03-07 ENCOUNTER — Ambulatory Visit (INDEPENDENT_AMBULATORY_CARE_PROVIDER_SITE_OTHER): Payer: Managed Care, Other (non HMO) | Admitting: Allergy & Immunology

## 2016-03-07 ENCOUNTER — Encounter: Payer: Self-pay | Admitting: Allergy & Immunology

## 2016-03-07 VITALS — BP 134/86 | HR 88 | Temp 98.6°F | Resp 20 | Ht 62.01 in | Wt 175.0 lb

## 2016-03-07 DIAGNOSIS — T781XXD Other adverse food reactions, not elsewhere classified, subsequent encounter: Secondary | ICD-10-CM | POA: Diagnosis not present

## 2016-03-07 DIAGNOSIS — L2084 Intrinsic (allergic) eczema: Secondary | ICD-10-CM | POA: Diagnosis not present

## 2016-03-07 DIAGNOSIS — J3089 Other allergic rhinitis: Secondary | ICD-10-CM

## 2016-03-07 DIAGNOSIS — J4531 Mild persistent asthma with (acute) exacerbation: Secondary | ICD-10-CM | POA: Diagnosis not present

## 2016-03-07 MED ORDER — ALBUTEROL SULFATE (2.5 MG/3ML) 0.083% IN NEBU: 2.5000 mg | INHALATION_SOLUTION | RESPIRATORY_TRACT | 1 refills | Status: DC | PRN

## 2016-03-07 MED ORDER — CRISABOROLE 2 % EX OINT: 1.0000 "application " | TOPICAL_OINTMENT | Freq: Two times a day (BID) | CUTANEOUS | 3 refills | Status: AC

## 2016-03-07 MED ORDER — TRIAMCINOLONE ACETONIDE 0.1 % EX OINT: TOPICAL_OINTMENT | CUTANEOUS | 2 refills | Status: DC

## 2016-03-07 MED ORDER — ALBUTEROL SULFATE HFA 108 (90 BASE) MCG/ACT IN AERS: INHALATION_SPRAY | RESPIRATORY_TRACT | 1 refills | Status: DC

## 2016-03-07 MED ORDER — LEVOCETIRIZINE DIHYDROCHLORIDE 5 MG PO TABS: 5.0000 mg | ORAL_TABLET | Freq: Every evening | ORAL | 5 refills | Status: DC

## 2016-03-07 MED ORDER — FLUTICASONE PROPIONATE 50 MCG/ACT NA SUSP: NASAL | 5 refills | Status: DC

## 2016-03-07 MED ORDER — EPINEPHRINE 0.3 MG/0.3ML IJ SOAJ: INTRAMUSCULAR | 1 refills | Status: DC

## 2016-03-07 MED ORDER — FLUTICASONE FUROATE 100 MCG/ACT IN AEPB: 1.0000 | INHALATION_SPRAY | Freq: Every day | RESPIRATORY_TRACT | 5 refills | Status: DC

## 2016-03-07 NOTE — Progress Notes (Signed)
FOLLOW UP  Date of Service/Encounter:  03/07/16   Assessment:   Mild persistent asthma with acute exacerbation  Perennial allergic rhinitis  Intrinsic atopic dermatitis  Adverse food reaction, subsequent encounter   Asthma Reportables:  Severity: mild persistent  Risk: high Control: not well controlled  Seasonal Influenza Vaccine: yes    Plan/Recommendations:   1. Mild persistent asthma with acute exacerbation - Lung function was slightly abnormal today. - Lung function did improve following DuoNeb administration. - Steroid pack provided in clinic today. - Stop the Qvar and start Arnuity . - Arnuity chosen because the patient does not want to use a spacer.  - Daily controller medication(s): Arnuity one inhalation once daily - Rescue medications: ProAir 4 puffs every 4-6 hours as needed or albuterol nebulizer one vial puffs every 4-6 hours as needed - Changes during respiratory infections or worsening symptoms: increase Arnuity to one inhalation twice daily for TWO WEEKS - Asthma control goals:  * Full participation in all desired activities (may need albuterol before activity) * Albuterol use two time or less a week on average (not counting use with activity) * Cough interfering with sleep two time or less a month * Oral steroids no more than once a year * No hospitalizations  2. Perennial allergic rhinitis - Continue with Flonase 2 sprays per nostril once daily. - Continue with cetirizine but increase to 20mg  (two tablets). - I emphasized the need to obtain dust mite coverings for Valerie [name]'s bedding. - Could consider allergy shots in the future as a means of controlling her allergic rhinitis and asthma.  3. Intrinsic atopic dermatitis - Continue with moisturizing twice daily with vaseline.  - Start triamcinolone 0.1% ointment twice daily to the worst areas.  - Start Eucrisa for the eczema flares on her face.  - Consider Dupixent  injections in the future as a means of controlling your eczema, asthma, and allergies.  - Consent signed and will be faxed to our biologics manager.   4. Adverse food reaction (seafood seasoning) - We will send in a prescription for AuviQ epinephrine injector. - Continue to avoid restaurants with the triggering seasoning.  - Workup for seafood allergy has been negative in the past.   5. Return in about 4 weeks (around 04/04/2016).     Subjective:   Valerie [name] is a 41 y.o. female presenting today for follow up of  Chief Complaint  Patient presents with  . Allergic Rhinitis   . Asthma  . Eczema    itching alot  . Cough    x's 3 weeks ago  . Wheezing    x's 3 weeks ago    Valerie [name] has a history of the following: Patient Active Problem List   Diagnosis Date Noted  . Allergic rhinitis 01/11/2014  . Endometriosis 01/11/2014  . Gastro-esophageal reflux disease without esophagitis 01/11/2014  . LBP (low back pain) 01/11/2014  . Excess, menstruation 01/11/2014  . Asthma, mild intermittent 01/11/2014  . 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  Valerie [name] was referred by FULBRIGHT, VIRGINIA E, PA-C.     Valerie [name] is a 41 y.o. female presenting for a follow up visit. She was last seen in April 2016. At that time, she was diagnosed with an asthma exacerbation and started on prednisone. She was also started on Qvar 40 g 2 puffs in the morning and 2 puffs at night as well as azithromycin for her sinusitis.  Her first visit with [place] was in December 2015. At that time, skin testing was notable for positives to dust mites, cat, and dog.  Since the last visit, she has had trouble following up because of multiple changes in her job status. 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 In any case she is generally much happier in her current position. Her  PCP has been refilling her medications since we have not seen her. From an asthma perspective, she has done fairly well until the 2-4 weeks. She reports problems with wheezing and coughing which have been notable with the abrupt change in temperatures. She has been using her nebulizer machine nightly. She had an attack yesterday which resulted in itching and sneezing. She took eye drops and took benadryl.    Valerie [name] has not been using the Qvar at all. She stopped using her Qvar around one month ago when she ran out it. She was using two puffs twice daily. She did NOT have a spacer. She also has a nose spray that she was using - two sprays per nostril daily. She does use this throughout the year. She takes Zyrtec on a daily basis as well. She estimates that she needed prednisone around two times in 2017. PCP provided these and she did not require ED or Urgent Care visits. While on the Qvar she has no problems with nighttime or daytime coughing or wheezing. Extreme weather changes to cause her asthma to get thrown off.   Valerie [name] does have eczema and has a topical cream that she has since used completely. She does need a refill. Her moisturizing regimen includes vaseline twice daily. She is scheduled to see a dermatologist - Dr. Rosita Fire - on February 19th.   Otherwise, there have been no changes to her past medical history, surgical history, family history, or social history. She does have two dogs at home, although they do not go into her bedroom.     Review of Systems: a 14-point review of systems is pertinent for what is mentioned in HPI.  Otherwise, all other systems were negative. Constitutional: negative other than that listed in the HPI Eyes: negative other than that listed in the HPI Ears, nose, mouth, throat, and face: negative other than that listed in the HPI Respiratory: negative other than that listed in the HPI Cardiovascular: negative other than that listed in the  HPI Gastrointestinal: negative other than that listed in the HPI Genitourinary: negative other than that listed in the HPI Integument: negative other than that listed in the HPI Hematologic: negative other than that listed in the HPI Musculoskeletal: negative other than that listed in the HPI Neurological: negative other than that listed in the HPI Allergy/Immunologic: negative other than that listed in the HPI    Objective:   Blood pressure 134/86, pulse 88, temperature 98.6 F (37 C), temperature source Oral, resp. rate 20, height 5' 2.01" (1.575 m), weight 175 lb 0.7 oz (79.4 kg). Body mass index is 32.01 kg/m.   Physical Exam:  General: Alert, interactive, in no acute distress. Cooperative with the exam. Pleasant female.  Eyes: No conjunctival injection present on the right, No conjunctival injection present on the left, PERRL bilaterally, No discharge on the right, No discharge on the left and No Horner-Trantas dots present Ears: Right TM pearly gray with normal light reflex, Left TM pearly gray with normal light reflex, Right TM intact  without perforation and Left TM intact without perforation.  Nose/Throat: External nose within normal limits, nasal crease present and septum midline, turbinates edematous and pale with clear discharge, post-pharynx erythematous with cobblestoning in the posterior oropharynx. Tonsils 2+ without exudates Neck: Supple without thyromegaly. Lungs: Decreased breath sounds bilaterally without wheezing, rhonchi or rales. No increased work of breathing. CV: Normal S1/S2, no murmurs. Capillary refill <2 seconds.  Skin: Dry, erythematous, excoriated patches on the upper back, face, as well as bilateral arms. Neuro:   Grossly intact. No focal deficits appreciated. Responsive to questions.   Diagnostic studies:  Spirometry: results abnormal (FEV1: 1.67/70%, FVC: 2.05/71%, FEV1/FVC: 81%).    Spirometry consistent with possible restrictive disease.  Albuterol/Atrovent nebulizer treatment given in clinic with significant improvement. The FEV1 increased 140 mL (8%) and the FVC increased 180 mL (90%).  Allergy Studies: None     Malachi BondsJoel Sahaana Weitman, MD [facility] [place] [place]

## 2016-03-07 NOTE — Patient Instructions (Addendum)
1. Mild persistent asthma with acute exacerbation - Lung function was slightly abnormal today. - Lung function did improve following DuoNeb administration. - Start the steroid pack provided in clinic today. - Stop the Qvar and start Arnuity 100mcg. - Daily controller medication(s): Arnuity 100mcg one inhalation once daily - Rescue medications: ProAir 4 puffs every 4-6 hours as needed or albuterol nebulizer one vial puffs every 4-6 hours as needed - Changes during respiratory infections or worsening symptoms: increase Arnuity 100mcg to one inhalation twice daily for TWO WEEKS - Asthma control goals:  * Full participation in all desired activities (may need albuterol before activity) * Albuterol use two time or less a week on average (not counting use with activity) * Cough interfering with sleep two time or less a month * Oral steroids no more than once a year * No hospitalizations  2. Perennial allergic rhinitis - Continue with Flonase 2 sprays per nostril once daily. - Continue with cetirizine but increase to 20mg  (two tablets). - Obtain dust mite coverings for your bedding.  3. Intrinsic atopic dermatitis - Continue with moisturizing twice daily with vaseline.  - Start triamcinolone 0.1% ointment twice daily to the worst areas.  Pam Drown- Eucrisa - Consider Dupixent injections in the future as a means of controlling your eczema, asthma, and allergies.   4. Adverse food reaction (seafood seasoning) - We will send in a prescription for AuviQ epinephrine injector. - Continue to avoid restaurants with the triggering seasoning.   5. Return in about 4 weeks (around 04/04/2016).  Please inform us of any Emergency Department visits, hospitalizations, or changes in symptoms. Call us before going to the ED for breathing or allergy symptoms since we might be able to fit you in for a sick visit. Feel free to contact us anytime with any questions, problems, or concerns.  It was a pleasure to meet you  today! Best wishes in the [place] Year!   Websites that have reliable patient information: 1. American Academy of Asthma, Allergy, and Immunology: www.aaaai.org 2. Food Allergy Research and Education (FARE): foodallergy.org 3. Mothers of Asthmatics: http://www.asthmacommunitynetwork.org 4. American College of Allergy, Asthma, and Immunology: www.acaai.org

## 2016-04-15 ENCOUNTER — Ambulatory Visit: Payer: Managed Care, Other (non HMO) | Admitting: Pediatrics

## 2016-05-02 ENCOUNTER — Telehealth: Payer: Self-pay | Admitting: *Deleted

## 2016-05-02 NOTE — Telephone Encounter (Signed)
That is unfortunate. I called just now and left a message as well. We will see.  Thanks, Malachi BondsJoel Desira Alessandrini, MD FAAAAI [facility] [place] [place]

## 2016-05-02 NOTE — Telephone Encounter (Signed)
Per Dr Dellis AnesGallagher submitted patient for Dupixent. Same has been ready for 4 weeks at [facility] and they have tried to contact patient. I have left her messages on 2/21,2/28,3/6 and 3/16. With no return call from patient. I will go ahead and move her to inactive since it looks like she is not interested in starting therapy.

## 2016-08-11 ENCOUNTER — Telehealth: Payer: Self-pay | Admitting: *Deleted

## 2016-08-11 NOTE — Telephone Encounter (Signed)
Notation made on account - kt

## 2016-08-11 NOTE — Telephone Encounter (Signed)
Patient wanted to let us know that [place] [facility] the [facility] and [place] all of her information with them and supposedly they should pay us soon.

## 2016-09-02 ENCOUNTER — Other Ambulatory Visit: Payer: Self-pay | Admitting: Allergy

## 2016-11-18 ENCOUNTER — Other Ambulatory Visit: Payer: Self-pay | Admitting: Allergy

## 2016-11-18 MED ORDER — FLUTICASONE PROPIONATE 50 MCG/ACT NA SUSP: NASAL | 5 refills | Status: DC

## 2016-12-18 HISTORY — PX: ROTATOR CUFF REPAIR: SHX139

## 2016-12-29 ENCOUNTER — Ambulatory Visit: Payer: 59 | Admitting: Pediatrics

## 2017-04-08 IMAGING — CT CT L SPINE W/ CM
3 of 10 series · 10 of 33 positions shown, 12 images · non-contrast
Comparison: Lumbar spine MRI 09/20/2014 from [REDACTED]

CLINICAL DATA: Lumbar radiculopathy. Low back and right leg pain
with leg weakness.
TECHNIQUE: Contiguous axial images were obtained through the Lumbar spine after
the intrathecal infusion of contrast. Coronal and sagittal
reconstructions were obtained of the axial image sets.

[Series 3: l spine soft · axial · 0.27mm/px · z∈[-178,-88]mm · 2 of 77 slices shown, 3 images]
[im 31/77  soft-tissue]
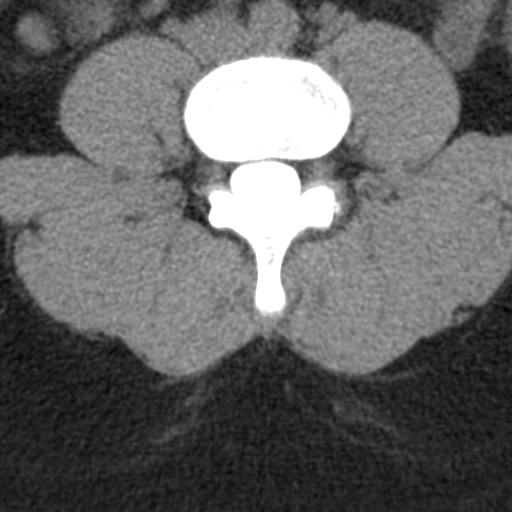
[im 31/77  bone]
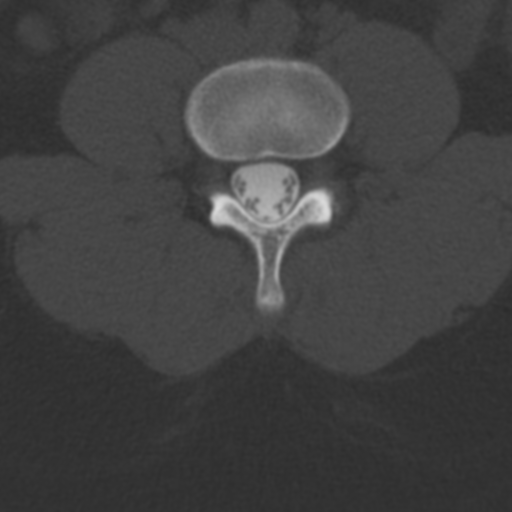
[im 61/77  bone]
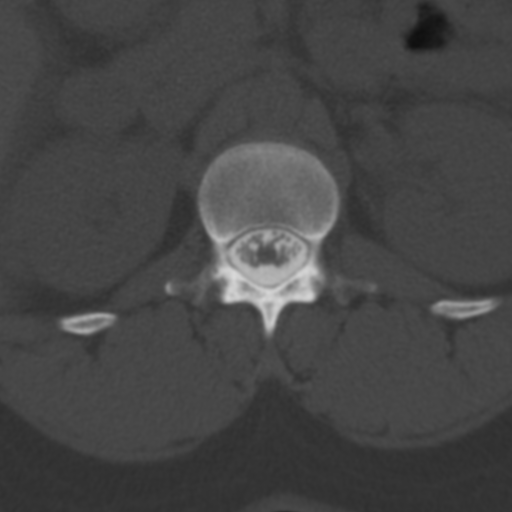

[Series 7: bone cor upper · coronal · 0.28mm/px · 3 of 29 slices shown]
[im 8/29  bone]
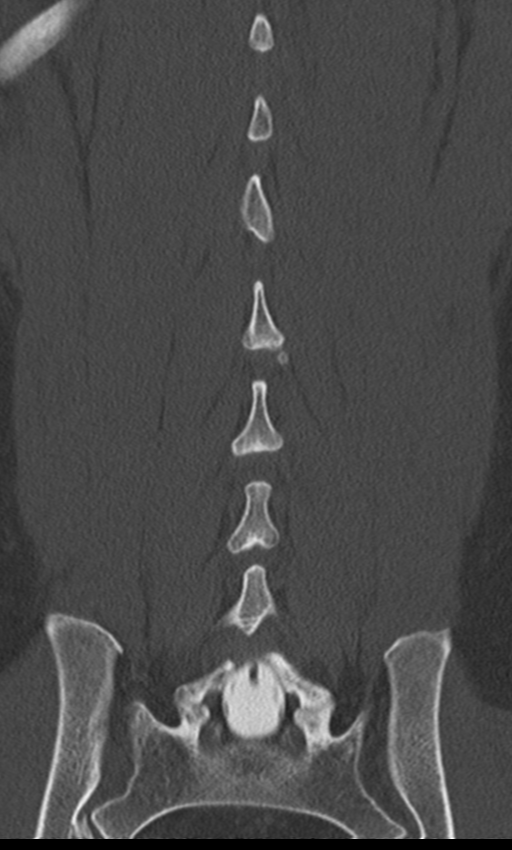
[im 15/29  bone]
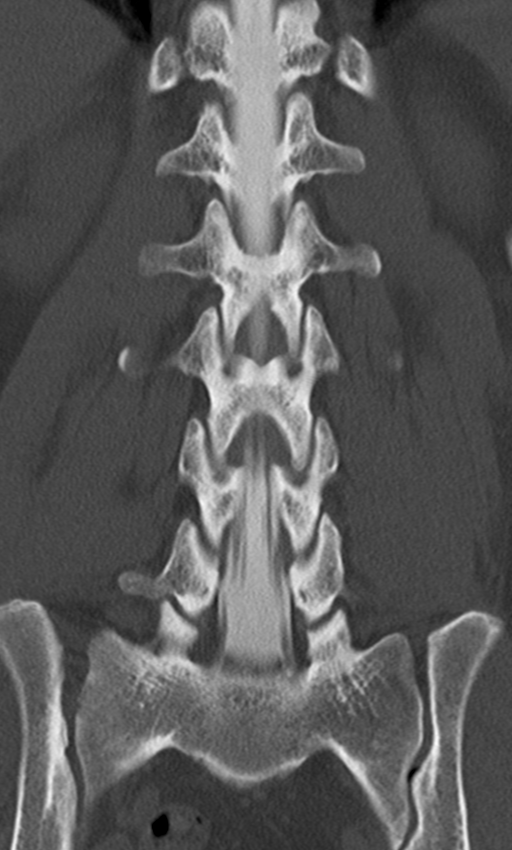
[im 22/29  bone]
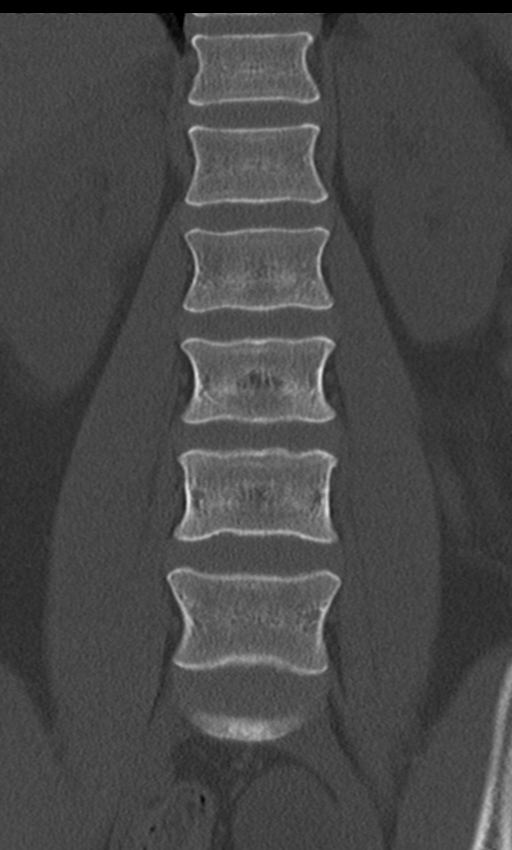

[Series 10: sag bone · sagittal · 0.27mm/px · 5 of 30 slices shown, 6 images]
[im 10/30  bone]
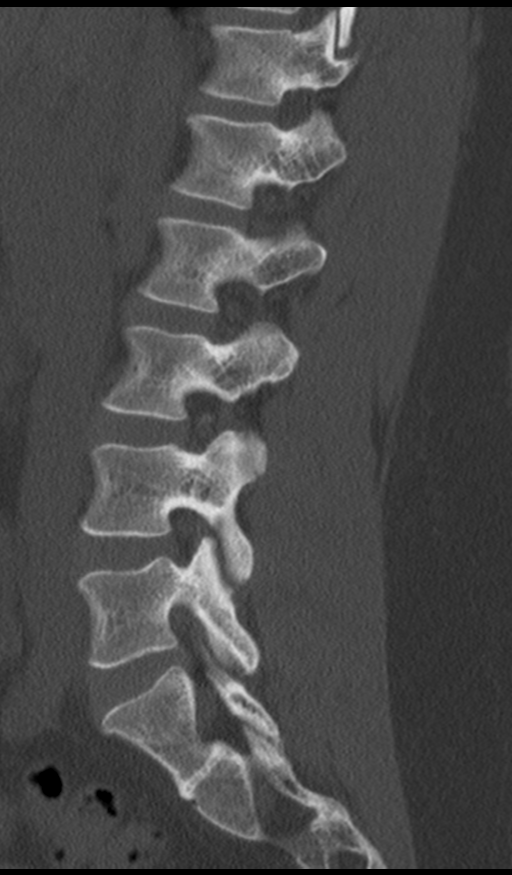
[im 13/30  bone]
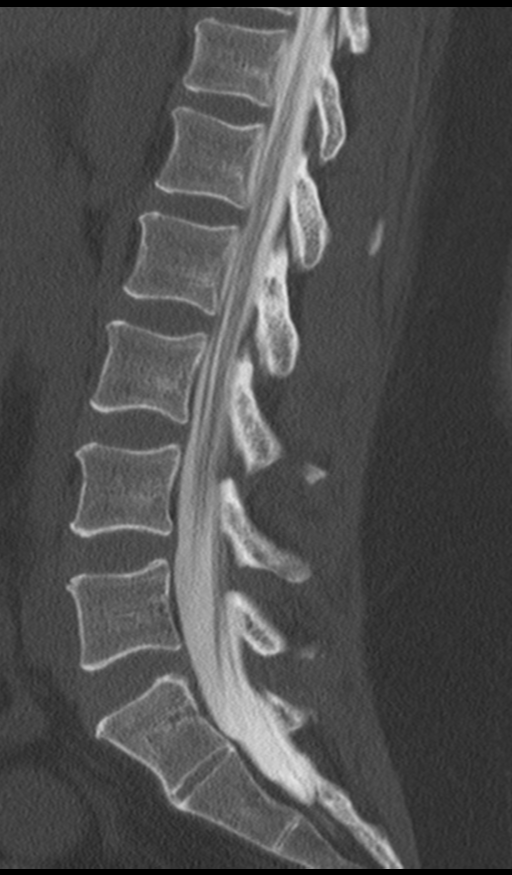
[im 15/30  soft-tissue]
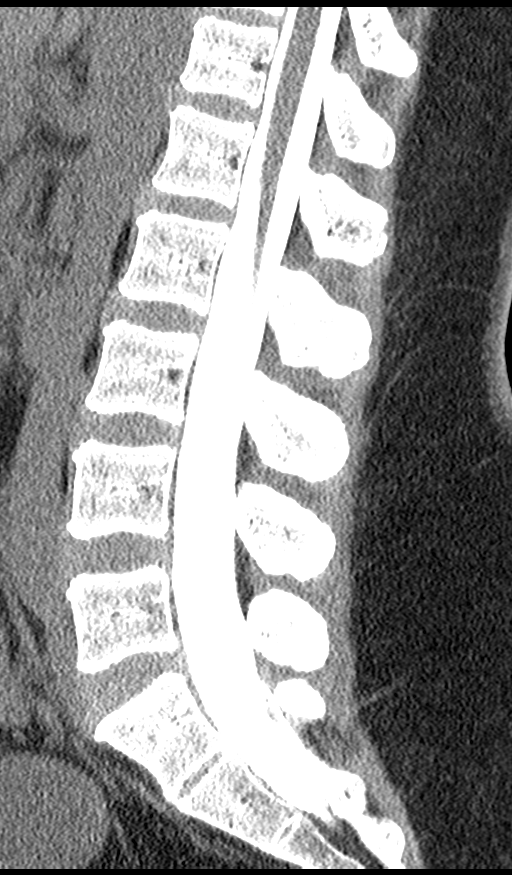
[im 15/30  bone]
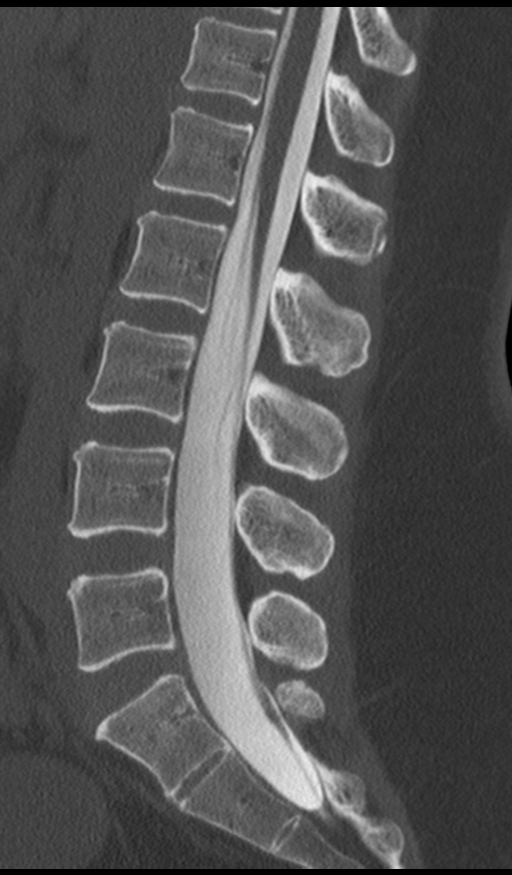
[im 17/30  bone]
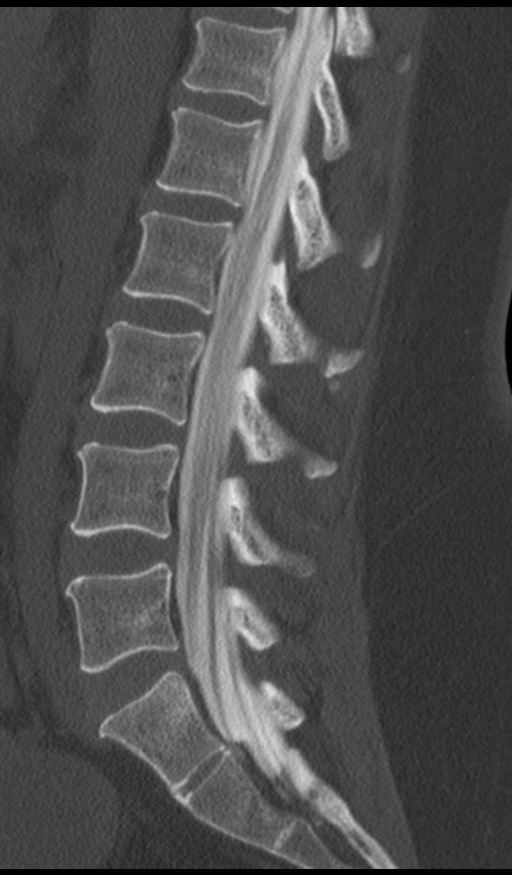
[im 20/30  bone]
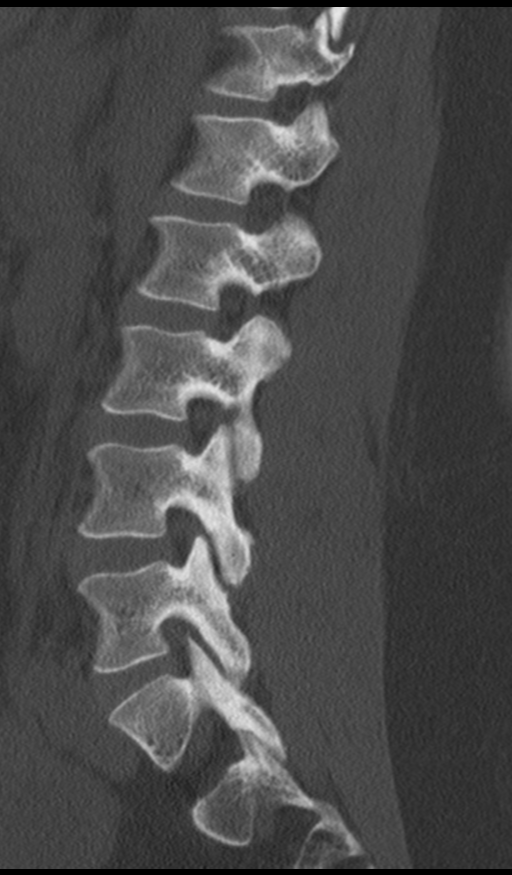

[10 of 33 positions shown; findings below may reference images not displayed]

EXAM:
LUMBAR MYELOGRAM

FLUOROSCOPY TIME:  Fluoroscopy Time (in minutes and seconds): 0
minutes 49 seconds

Number of Acquired Images:  12

PROCEDURE:
After thorough discussion of risks and benefits of the procedure
including bleeding, infection, injury to nerves, blood vessels,
adjacent structures as well as headache and CSF leak, written and
oral informed consent was obtained. Consent was obtained by Dr.
Monta Ela. Time out form was completed.

Patient was positioned prone on the fluoroscopy table. Local
anesthesia was provided with 1% lidocaine without epinephrine after
prepped and draped in the usual sterile fashion. Puncture was
performed at L3-4 using a 3 1/2 inch 22-gauge spinal needle via a
left paramedian approach. Using a single pass through the dura, the
needle was placed within the thecal sac, with return of clear CSF.
15 mL of [id number] was injected into the thecal sac, with normal
opacification of the nerve roots and cauda equina consistent with
free flow within the subarachnoid space.

I personally performed the lumbar puncture and administered the
intrathecal contrast. I also personally supervised acquisition of
the myelogram images.
FINDINGS: LUMBAR MYELOGRAM FINDINGS:

There is transitional lumbosacral anatomy with partial lumbarization
of S1 and a rudimentary disc at S1-2. The lowest fully formed
intervertebral disc space is labeled L5-S1.

There is trace retrolisthesis of L5 on S1 without significant change
during flexion or extension. The lumbar spinal canal appears widely
patent. No nerve root cut off is seen. An intrauterine contraceptive
device is partially visualized.

CT LUMBAR MYELOGRAM FINDINGS:

Minimal retrolisthesis is again seen of L5 on S1 measuring
approximately 3 mm. Vertebral body heights and intervertebral disc
space heights are preserved. Conus medullaris terminates at L2. A
left adnexal cyst is incompletely visualized, estimated to measure
approximately 4 cm.

T12-L1 through L3-4: Negative.

L4-5: Minimal left facet arthrosis without disc herniation or
stenosis.

L5-S1: Slight retrolisthesis with minimal disc uncovering, unchanged
and without evidence of significant stenosis.
IMPRESSION: 1. Transitional lumbosacral anatomy as above.
2. Minimal retrolisthesis of L5 on S1 without evidence of stenosis
or neural impingement.

## 2017-08-24 ENCOUNTER — Ambulatory Visit (INDEPENDENT_AMBULATORY_CARE_PROVIDER_SITE_OTHER): Payer: BC Managed Care – PPO | Admitting: Family Medicine

## 2017-08-24 ENCOUNTER — Encounter: Payer: Self-pay | Admitting: Family Medicine

## 2017-08-24 VITALS — BP 140/78 | HR 90 | Temp 98.1°F | Resp 16 | Ht 62.2 in | Wt 175.0 lb

## 2017-08-24 DIAGNOSIS — L2084 Intrinsic (allergic) eczema: Secondary | ICD-10-CM | POA: Diagnosis not present

## 2017-08-24 DIAGNOSIS — J4540 Moderate persistent asthma, uncomplicated: Secondary | ICD-10-CM

## 2017-08-24 DIAGNOSIS — L253 Unspecified contact dermatitis due to other chemical products: Secondary | ICD-10-CM | POA: Diagnosis not present

## 2017-08-24 DIAGNOSIS — J4531 Mild persistent asthma with (acute) exacerbation: Secondary | ICD-10-CM

## 2017-08-24 DIAGNOSIS — J3089 Other allergic rhinitis: Secondary | ICD-10-CM | POA: Diagnosis not present

## 2017-08-24 DIAGNOSIS — T781XXA Other adverse food reactions, not elsewhere classified, initial encounter: Secondary | ICD-10-CM | POA: Insufficient documentation

## 2017-08-24 DIAGNOSIS — L2089 Other atopic dermatitis: Secondary | ICD-10-CM | POA: Insufficient documentation

## 2017-08-24 DIAGNOSIS — T781XXD Other adverse food reactions, not elsewhere classified, subsequent encounter: Secondary | ICD-10-CM | POA: Insufficient documentation

## 2017-08-24 DIAGNOSIS — L209 Atopic dermatitis, unspecified: Secondary | ICD-10-CM | POA: Insufficient documentation

## 2017-08-24 HISTORY — DX: Mild persistent asthma with (acute) exacerbation: J45.31

## 2017-08-24 MED ORDER — TRIAMCINOLONE ACETONIDE 0.1 % EX OINT: TOPICAL_OINTMENT | CUTANEOUS | 1 refills | Status: DC

## 2017-08-24 NOTE — Progress Notes (Signed)
[address] [place] [address] Dept: [phone]  FOLLOW UP NOTE  Patient ID: Valerie Valerie [name], Valerie [name]    DOB: 1975/07/26  Age: 42 y.o. MRN: [id number] Date of Office Visit: 08/24/2017  Assessment  Chief Complaint: Allergic Reaction (to hair weave braids. scalp itchy red. occured 08/18/17)  HPI Valerie Valerie [name] is a 42 year old Valerie [name] who presents to the clinic for a follow up visit. She reports that she got her hair braided with a new brand of hair on Saturday. She reports that, 2 days later, she began to experience redness and itching on her scalp. At that time, she took out the hair and washed her natural hair and scalp. She applied triamcinolone 0.1% ointment to her scalp for about 5 minutes and washed it off again. She reports her scalp is still itchy and red with scabs. She reports that she has sensitive skin and continues to use dye free and perfume free soaps and lotions. Other than her scalp, she reports her atopic dermatitis has been well controlled with cetirizine 10 mg once a day as well as a daily moisturizing routine.   Asthma is reported as well controlled with no shortness of breath, wheeze or cough with activity or rest. She continues Arnuity 100- 1 puff once a day and infrequently uses her albuterol inhaler. She has recently added bee pollen to her smoothies once a week which she believes is improving her breathing.   Allergic rhinitis is reported as well controlled with cetirizine and Flonase nasal spray as needed.  She continues to avoid shrimp seasoning. She has not had any accidental ingestions nor has she needed to use her EpiPen since her last visit to this office.   Her current medications are listed in the chart.    Drug Allergies:  Allergies  Allergen Reactions  . Gabapentin Nausea And Vomiting  . Other Nausea And Vomiting    Was caused by some type of anesthesia  . Lactose Diarrhea  . Lactose Intolerance (Gi)     Stomach pain and diarrhea   .  Lidocaine Nausea And Vomiting  . Molds & Smuts Other (See Comments)    Flares asthma   . Oxycodone-Acetaminophen Itching    Takes benadryl with medication  . Percocet [Oxycodone-Acetaminophen] Itching    Takes benadryl with medication    Physical Exam: BP 140/78 (BP Location: Right Arm, Patient Position: Sitting, Cuff Size: Normal)   Pulse 90   Temp 98.1 F (36.7 C) (Oral)   Resp 16   Ht 5' 2.2" (1.58 m)   Wt 175 lb (79.4 kg)   SpO2 96%   BMI 31.80 kg/m    Physical Exam  Constitutional: She is oriented to person, place, and time. She appears well-developed and well-nourished.  HENT:  Head: Normocephalic.  Right Ear: External ear normal.  Left Ear: External ear normal.  Nose: Nose normal.  Mouth/Throat: Oropharynx is clear and moist.  Eyes: Conjunctivae are normal.  Neck: Normal range of motion. Neck supple.  Cardiovascular: Normal rate, regular rhythm and normal heart sounds.  No murmur noted  Pulmonary/Chest: Effort normal and breath sounds normal.  Lungs clear to auscultation  Musculoskeletal: Normal range of motion.  Neurological: She is alert and oriented to person, place, and time.  Skin: Skin is warm and dry.  Dry reddened areas on scalp. Some flaky and scabbed areas noted. No drainage or open areas noted.  Psychiatric: She has a normal mood and affect. Her behavior is normal. Judgment and  thought content normal.  Vitals reviewed.   Diagnostics: FVC 2.49, FEV! 1.86. Predicted FVC 2.84, predicted FEV1 2.35. Spirometry is within the normal range.   Assessment and Plan: 1. Contact dermatitis due to chemicals   2. Moderate persistent asthma without complication   3. Perennial allergic rhinitis   4. Intrinsic atopic dermatitis   5. Adverse food reaction, subsequent encounter     Meds ordered this encounter  Medications  . triamcinolone ointment (KENALOG) 0.1 %    Sig: Apply to twice daily to red itchy areas    Dispense:  453.6 g    Refill:  1    Below  face    Patient Instructions  Begin prednisone 10 mg tablets. Take 2 tablets twice a day for 3 days, then take 2 tablets once a day for 1 day, then take 1 tablet once on the 5th day, then stop. You may use triamcinolone ointment 0.1% twice a day on red or itchy areas.  [facility] Dr. Isaac LaudAmy McMichael if possible Continue cetirizine 10 mg once a day in the morning followed by Benadryl 25 mg once at night if needed for itching Continue Arnutiy 100- 1 puff once a day to prevent cough or wheeze Continue ProAir 2 puffs every 4 hours as needed for cough or wheeze Continue Flonase as needed for a stuffy nose Continue to avoid seafood seasoning. In case of an allergic reaction, give Benadryl 50 mg every 6 hours, and if life-threatening symptoms occur, inject with EpiPen 0.3 mg.  Call me if this plan is not working well for you Continue the other medications as listed in your chart  Follow up in 2 months or sooner if needed   Return in about 2 months (around 10/25/2017), or if symptoms worsen or fail to improve.   Thank you for the opportunity to care for this patient.  Please do not hesitate to contact me with questions.  Thermon LeylandAnne Ambs, FNP [facility]  I have provided oversight concerning Thermon Leylandnne Ambs' evaluation and treatment of this patient's health issues addressed during today's encounter. I agree with the assessment and therapeutic plan as outlined in the note.   Thank you for the opportunity to care for this patient.  Please do not hesitate to contact me with questions.  Tonette BihariJ. A. Bardelas, M.D.  [facility] [address] [place], [place] [address] [phone]

## 2017-08-24 NOTE — Patient Instructions (Addendum)
Begin prednisone 10 mg tablets. Take 2 tablets twice a day for 3 days, then take 2 tablets once a day for 1 day, then take 1 tablet once on the 5th day, then stop. You may use triamcinolone ointment 0.1% twice a day on red or itchy areas.  [facility] Dr. Isaac LaudAmy McMichael if possible Continue cetirizine 10 mg once a day in the morning followed by Benadryl 25 mg once at night if needed for itching Continue Arnutiy 100- 1 puff once a day to prevent cough or wheeze Continue ProAir 2 puffs every 4 hours as needed for cough or wheeze Continue Flonase as needed for a stuffy nose Continue to avoid seafood seasoning. In case of an allergic reaction, give Benadryl 50 mg every 6 hours, and if life-threatening symptoms occur, inject with EpiPen 0.3 mg.  Call me if this plan is not working well for you Continue the other medications as listed in your chart  Follow up in 2 months or sooner if needed

## 2018-03-30 ENCOUNTER — Other Ambulatory Visit: Payer: Self-pay | Admitting: Family Medicine

## 2018-05-17 ENCOUNTER — Other Ambulatory Visit: Payer: Self-pay | Admitting: Pediatrics

## 2018-05-23 ENCOUNTER — Other Ambulatory Visit: Payer: Self-pay | Admitting: Family Medicine

## 2018-06-16 ENCOUNTER — Other Ambulatory Visit: Payer: Self-pay | Admitting: Family Medicine

## 2018-07-01 ENCOUNTER — Telehealth: Payer: Self-pay | Admitting: Family Medicine

## 2018-07-01 MED ORDER — TRIAMCINOLONE ACETONIDE 0.1 % EX OINT: TOPICAL_OINTMENT | CUTANEOUS | 0 refills | Status: DC

## 2018-07-01 NOTE — Telephone Encounter (Signed)
Patient went to pick up her trimcinolone ointment and it was only for 80g. She said in the past, she was given a 454 count container and would like that sent in to [facility] on [address]

## 2018-07-01 NOTE — Telephone Encounter (Signed)
I left a detailed message advising patient of this information.  

## 2018-07-01 NOTE — Telephone Encounter (Signed)
I have sent the prescription in again with corrected amount. The patient will need to make an office visit soon to get more refills on other medications. I called her to let her know but did not receive an answer. I was unable to leave a message.

## 2018-07-01 NOTE — Addendum Note (Signed)
Addended by: Mliss Fritz I on: 07/01/2018 02:55 PM   Modules accepted: Orders

## 2018-10-28 ENCOUNTER — Other Ambulatory Visit: Payer: Self-pay | Admitting: Allergy & Immunology

## 2018-10-31 ENCOUNTER — Other Ambulatory Visit: Payer: Self-pay | Admitting: Allergy & Immunology

## 2018-11-04 ENCOUNTER — Other Ambulatory Visit: Payer: Self-pay | Admitting: Family Medicine

## 2019-02-01 ENCOUNTER — Other Ambulatory Visit: Payer: Self-pay

## 2019-02-01 ENCOUNTER — Ambulatory Visit (INDEPENDENT_AMBULATORY_CARE_PROVIDER_SITE_OTHER): Payer: BC Managed Care – PPO | Admitting: Pediatrics

## 2019-02-01 ENCOUNTER — Encounter: Payer: Self-pay | Admitting: Pediatrics

## 2019-02-01 VITALS — BP 116/76 | HR 90 | Temp 97.4°F | Resp 16 | Ht 62.5 in | Wt 144.8 lb

## 2019-02-01 DIAGNOSIS — L2084 Intrinsic (allergic) eczema: Secondary | ICD-10-CM

## 2019-02-01 DIAGNOSIS — J3089 Other allergic rhinitis: Secondary | ICD-10-CM | POA: Diagnosis not present

## 2019-02-01 DIAGNOSIS — T781XXD Other adverse food reactions, not elsewhere classified, subsequent encounter: Secondary | ICD-10-CM

## 2019-02-01 DIAGNOSIS — J4540 Moderate persistent asthma, uncomplicated: Secondary | ICD-10-CM | POA: Diagnosis not present

## 2019-02-01 MED ORDER — FLUTICASONE PROPIONATE 50 MCG/ACT NA SUSP: NASAL | 0 refills | Status: DC

## 2019-02-01 MED ORDER — ALBUTEROL SULFATE HFA 108 (90 BASE) MCG/ACT IN AERS: INHALATION_SPRAY | RESPIRATORY_TRACT | 1 refills | Status: DC

## 2019-02-01 MED ORDER — TRIAMCINOLONE ACETONIDE 0.1 % EX OINT: TOPICAL_OINTMENT | CUTANEOUS | 0 refills | Status: DC

## 2019-02-01 MED ORDER — ARNUITY ELLIPTA 100 MCG/ACT IN AEPB: 1.0000 | INHALATION_SPRAY | Freq: Every day | RESPIRATORY_TRACT | 5 refills | Status: DC

## 2019-02-01 MED ORDER — EPINEPHRINE 0.3 MG/0.3ML IJ SOAJ: INTRAMUSCULAR | 1 refills | Status: AC

## 2019-02-01 NOTE — Patient Instructions (Signed)
Levocetirizine 10 mg in the morning and Benadryl 25 mg at night if needed for itching Fluticasone 2 sprays per nostril once a day if needed for stuffy nose  Arnuity 100-1 puff once a day to prevent coughing or wheezing ProAir 2 puffs every 4 hours if needed for wheezing or coughing spells.  You may use ProAir 2 puffs 5 to 15 minutes before exercise  Continue to avoid seafood seasoning.  In case of an allergic reaction give Benadryl 50 mg every  6 hours and if life-threatening symptoms occur, inject with EpiPen 0.3 mg  Triamcinolone ointment 0.1% twice a day if needed to red itchy areas below the face  Continue on  your other medications Call us if you are not doing well on this treatment plan

## 2019-02-01 NOTE — Progress Notes (Signed)
[address] [place] [address] Dept: [phone]  FOLLOW UP NOTE  Patient ID: Valerie [name], female    DOB: October 17, 1975  Age: 43 y.o. MRN: [id number] Date of Office Visit: 02/01/2019  Assessment  Chief Complaint: Asthma  HPI Valerie [name] presents for follow-up of asthma, allergic rhinitis and dermatitis. She has been seen at [facility] dermatology and does well if she uses triamcinolone ointment 0.1% twice a day if needed to red itchy areas below the face  Her asthma is well controlled by Arnuity 100-1 puff once a day.  Her nasal symptoms are controlled by levocetirizine 5 mg once a day and fluticasone 2 sprays per nostril once a day if needed.  She continues to avoid seafood seasoning   Drug Allergies:  Allergies  Allergen Reactions  . Gabapentin Nausea And Vomiting  . Other Nausea And Vomiting    Was caused by some type of anesthesia  . Lactose Diarrhea  . Lactose Intolerance (Gi)     Stomach pain and diarrhea   . Lidocaine Nausea And Vomiting  . Molds & Smuts Other (See Comments)    Flares asthma   . Oxycodone-Acetaminophen Itching    Takes benadryl with medication  . Percocet [Oxycodone-Acetaminophen] Itching    Takes benadryl with medication    Physical Exam: BP 116/76   Pulse 90   Temp (!) 97.4 F (36.3 C) (Temporal)   Resp 16   Ht 5' 2.5" (1.588 m)   Wt 144 lb 12.8 oz (65.7 kg)   SpO2 98%   BMI 26.06 kg/m    Physical Exam Vitals reviewed.  Constitutional:      Appearance: Normal appearance. She is normal weight.  HENT:     Head:     Comments: Eyes normal.  Ears normal.  Nose normal.  Pharynx normal. Cardiovascular:     Comments: S1-S2 normal no murmurs Pulmonary:     Comments: Clear to percussion and auscultation Musculoskeletal:     Cervical back: Neck supple.  Lymphadenopathy:     Cervical: No cervical adenopathy.  Neurological:     General: No focal deficit present.     Mental Status: She is alert and oriented to person,  place, and time. Mental status is at baseline.  Psychiatric:        Mood and Affect: Mood normal.        Behavior: Behavior normal.        Thought Content: Thought content normal.        Judgment: Judgment normal.     Diagnostics: FVC 2.44 L FEV1 1.94 L.  Predicted FVC 2.95 L predicted FEV1 2.43 L-the spirometry is in the normal range  Assessment and Plan: 1. Moderate persistent asthma without complication   2. Perennial allergic rhinitis   3. Intrinsic atopic dermatitis   4. Adverse food reaction, subsequent encounter     Meds ordered this encounter  Medications  . triamcinolone ointment (KENALOG) 0.1 %    Sig: APPLY OINTMENT TOPICALLY TWICE DAILY TO RED ITCHY AREAS BELOW FACE AND NECK    Dispense:  454 g    Refill:  0  . Fluticasone Furoate (ARNUITY ELLIPTA) 100 MCG/ACT AEPB    Sig: Inhale 1 puff into the lungs daily.    Dispense:  30 each    Refill:  5    To prevent cough or wheeze  . albuterol (PROAIR HFA) 108 (90 Base) MCG/ACT inhaler    Sig: 2 puffs every 4 hours as needed or 5-15  minutes before exercise    Dispense:  18 g    Refill:  1    For cough or wheeze  . fluticasone (FLONASE) 50 MCG/ACT nasal spray    Sig: USE 2 SPRAY(S) IN EACH NOSTRIL ONCE DAILY FOR  STUFFY  NOSE    Dispense:  16 g    Refill:  0  . EPINEPHrine (AUVI-Q) 0.3 mg/0.3 mL IJ SOAJ injection    Sig: Use as directed for severe allergic reaction    Dispense:  2 each    Refill:  1    Patient Instructions  Levocetirizine 10 mg in the morning and Benadryl 25 mg at night if needed for itching Fluticasone 2 sprays per nostril once a day if needed for stuffy nose  Arnuity 100-1 puff once a day to prevent coughing or wheezing ProAir 2 puffs every 4 hours if needed for wheezing or coughing spells.  You may use ProAir 2 puffs 5 to 15 minutes before exercise  Continue to avoid seafood seasoning.  In case of an allergic reaction give Benadryl 50 mg every  6 hours and if life-threatening symptoms occur,  inject with EpiPen 0.3 mg  Triamcinolone ointment 0.1% twice a day if needed to red itchy areas below the face  Continue on  your other medications Call us if you are not doing well on this treatment plan    Return in about 6 months (around 08/02/2019).    Thank you for the opportunity to care for this patient.  Please do not hesitate to contact me with questions.  Penne Lash, M.D.  [facility] [address] [place], [place] [address] [phone]

## 2019-03-11 ENCOUNTER — Other Ambulatory Visit: Payer: Self-pay

## 2019-03-11 ENCOUNTER — Other Ambulatory Visit: Payer: Self-pay | Admitting: Pediatrics

## 2019-03-11 MED ORDER — TRIAMCINOLONE ACETONIDE 0.1 % EX OINT: TOPICAL_OINTMENT | CUTANEOUS | 0 refills | Status: DC

## 2019-03-11 NOTE — Telephone Encounter (Signed)
PT called to get refill of triamcinolone ointment 454g sent [facility] on [address]

## 2019-03-11 NOTE — Telephone Encounter (Signed)
Sent in refill of her triamcinolone 454 grams with no refills.

## 2019-03-11 NOTE — Telephone Encounter (Signed)
Sent in refill for triamcinolone 0.1% ointment to [address] [address].

## 2019-04-11 ENCOUNTER — Other Ambulatory Visit: Payer: Self-pay | Admitting: Pediatrics

## 2019-05-29 ENCOUNTER — Other Ambulatory Visit: Payer: Self-pay | Admitting: Pediatrics

## 2019-07-27 ENCOUNTER — Other Ambulatory Visit: Payer: Self-pay | Admitting: Pediatrics

## 2019-12-29 ENCOUNTER — Other Ambulatory Visit: Payer: Self-pay | Admitting: Pediatrics

## 2020-01-03 ENCOUNTER — Other Ambulatory Visit: Payer: Self-pay | Admitting: Pediatrics

## 2020-06-22 ENCOUNTER — Encounter: Payer: Self-pay | Admitting: Allergy

## 2020-06-22 ENCOUNTER — Other Ambulatory Visit: Payer: Self-pay

## 2020-06-22 ENCOUNTER — Ambulatory Visit (INDEPENDENT_AMBULATORY_CARE_PROVIDER_SITE_OTHER): Payer: 59 | Admitting: Allergy

## 2020-06-22 VITALS — BP 118/72 | HR 105 | Resp 16 | Ht 61.5 in | Wt 168.8 lb

## 2020-06-22 DIAGNOSIS — T781XXD Other adverse food reactions, not elsewhere classified, subsequent encounter: Secondary | ICD-10-CM

## 2020-06-22 DIAGNOSIS — J4541 Moderate persistent asthma with (acute) exacerbation: Secondary | ICD-10-CM

## 2020-06-22 DIAGNOSIS — J3089 Other allergic rhinitis: Secondary | ICD-10-CM | POA: Diagnosis not present

## 2020-06-22 DIAGNOSIS — L2089 Other atopic dermatitis: Secondary | ICD-10-CM

## 2020-06-22 DIAGNOSIS — J069 Acute upper respiratory infection, unspecified: Secondary | ICD-10-CM

## 2020-06-22 MED ORDER — FLUCONAZOLE 150 MG PO TABS: 150.0000 mg | ORAL_TABLET | Freq: Every day | ORAL | 0 refills | Status: DC

## 2020-06-22 MED ORDER — BREO ELLIPTA 200-25 MCG/INH IN AEPB: 1.0000 | INHALATION_SPRAY | Freq: Every day | RESPIRATORY_TRACT | 0 refills | Status: DC

## 2020-06-22 MED ORDER — AZITHROMYCIN 250 MG PO TABS: ORAL_TABLET | ORAL | 0 refills | Status: DC

## 2020-06-22 NOTE — Assessment & Plan Note (Signed)
   Continue proper skin care.  Try to limit steroid use but patient states only daily use helps as she knows her body. Discussed side effects of long term topical steroid creams.

## 2020-06-22 NOTE — Assessment & Plan Note (Signed)
Past history - 2015 skin testing positive to dust mites, cat, dog.  Continue environmental control measures.  May use Flonase (fluticasone) nasal spray 1 spray per nostril twice a day as needed for nasal congestion.   May use over the counter antihistamines such as Zyrtec (cetirizine), Claritin (loratadine), Allegra (fexofenadine), or Xyzal (levocetirizine) daily as needed.

## 2020-06-22 NOTE — Assessment & Plan Note (Signed)
4 day of symptoms with no fevers. Negative at home covid-19 testing per patient. Requesting antibiotics and diflucan as she gets yeast infections.  Discussed at length that most URIs are caused by viruses rather than bacteria and typically improve on its own over 7-10 days.   Patient is insistent that she knows her body and she needs antibiotics. She also gets yeast infections with antibiotics.   Advised patient to do symptomatic management and start prednisone for the asthma flare first rather starting the antibiotics which apparently gives her yeast infections.  Sent in Rx for antibiotics and diflucan and asked pharmacy to put on hold so patient can start with above regimen first and if no improvement within the next few days then to star the antibiotics.

## 2020-06-22 NOTE — Assessment & Plan Note (Signed)
Patient has been using Arnuity 2 puffs BID and albuterol daily with some benefit. No recent prednisone. Negative at home covid-19 testing. Start prednisone taper.   Take Breo 1 puff daily and rinse mouth after each use for 2 weeks.   Then resume Arnuity 1 puff daily as maintenance.  . Daily controller medication(s): Arnuity 1 puff once a day and rinse mouth after each use.  . May use albuterol rescue inhaler 2 puffs every 4 to 6 hours as needed for shortness of breath, chest tightness, coughing, and wheezing. May use albuterol rescue inhaler 2 puffs 5 to 15 minutes prior to strenuous physical activities. Monitor frequency of use.  . Will get spirometry at next visit instead of today due to COVID-19 pandemic and trying to minimize any type of aerosolizing procedures at this time in the office.

## 2020-06-22 NOTE — Patient Instructions (Addendum)
Asthma: Prednisone 10mg  tablet pack: 2 tablets given in office today. Take 2 more tablets before bed today.  Then take 2 tablets twice a day for 2 more days. Then take 2 tablets once a day for 1 day. Then take 1 tablet once a day for 1 day.   YOU MOST LIKELY HAVE A VIRAL INFECTION. VIRAL INFECTIONS do NOT need antibiotics and usually improve over 7-10 days.  If you notice any worsening symptoms after you start prednisone (wait 1-2 days) then start antibiotics as prescribed.  Take Breo 1 puff daily and rinse mouth after each use for 2 weeks.  Then resume Arnuity 1 puff daily as maintenance.   . Daily controller medication(s): Arnuity 1 puff once a day and rinse mouth after each use.  . May use albuterol rescue inhaler 2 puffs every 4 to 6 hours as needed for shortness of breath, chest tightness, coughing, and wheezing. May use albuterol rescue inhaler 2 puffs 5 to 15 minutes prior to strenuous physical activities. Monitor frequency of use.  . Asthma control goals:  o Full participation in all desired activities (may need albuterol before activity) o Albuterol use two times or less a week on average (not counting use with activity) o Cough interfering with sleep two times or less a month o Oral steroids no more than once a year o No hospitalizations  Allergic rhinitis  2015 skin testing positive to dust mites, cat, dog.  Continue environmental control measures.  May use Flonase (fluticasone) nasal spray 1 spray per nostril twice a day as needed for nasal congestion.   May use over the counter antihistamines such as Zyrtec (cetirizine), Claritin (loratadine), Allegra (fexofenadine), or Xyzal (levocetirizine) daily as needed.  Atopic dermatitis:  Continue proper skin care.  Try to limit steroid use.   Food:  Avoid seafood seasoning.   For mild symptoms you can take over the counter antihistamines such as Benadryl and monitor symptoms closely. If symptoms  worsen or if you have severe symptoms including breathing issues, throat closure, significant swelling, whole body hives, severe diarrhea and vomiting, lightheadedness then inject epinephrine and seek immediate medical care afterwards.  Follow up in 4 months for regular visit or sooner if needed.    Drink plenty of fluids.  Water, juice, clear broth or warm lemon water are good choices. Avoid caffeine and alcohol, which can dehydrate you.  Eat chicken soup.  Chicken soup and other warm fluids can be soothing and loosen congestion.  Rest.  Adjust your room's temperature and humidity.  Keep your room warm but not overheated. If the air is dry, a cool-mist humidifier or vaporizer can moisten the air and help ease congestion and coughing. Keep the humidifier clean to prevent the growth of bacteria and molds.  Soothe your throat.  Perform a saltwater gargle. Dissolve one-quarter to a half teaspoon of salt in a 4- to 8-ounce glass of warm water. This can relieve a sore or scratchy throat temporarily.  Use saline nasal drops.  To help relieve nasal congestion, try saline nasal drops. You can buy these drops over the counter, and they can help relieve symptoms ? even in children.  Take over-the-counter cold and cough medications.  For adults and children older than 5, over-the-counter decongestants, antihistamines and pain relievers might offer some symptom relief. However, they won't prevent a cold or shorten its duration.  Control of House Dust Mite Allergen . Dust mite allergens are a common trigger of allergy and asthma symptoms.  While they can be found throughout the house, these microscopic creatures thrive in warm, humid environments such as bedding, upholstered furniture and carpeting. . Because so much time is spent in the bedroom, it is essential to reduce mite levels there.  . Encase pillows, mattresses, and box springs in special allergen-proof fabric covers or airtight,  zippered plastic covers.  . Bedding should be washed weekly in hot water (130 F) and dried in a hot dryer. Allergen-proof covers are available for comforters and pillows that can't be regularly washed.  Valerie [name] the allergy-proof covers every few months. Minimize clutter in the bedroom. Keep pets out of the bedroom.  Marland Kitchen Keep humidity less than 50% by using a dehumidifier or air conditioning. You can buy a humidity measuring device called a hygrometer to monitor this.  . If possible, replace carpets with hardwood, linoleum, or washable area rugs. If that's not possible, vacuum frequently with a vacuum that has a HEPA filter. . Remove all upholstered furniture and non-washable window drapes from the bedroom. . Remove all non-washable stuffed toys from the bedroom.  Wash stuffed toys weekly. Pet Allergen Avoidance: . Contrary to popular opinion, there are no "hypoallergenic" breeds of dogs or cats. That is because people are not allergic to an animal's hair, but to an allergen found in the animal's saliva, dander (dead skin flakes) or urine. Pet allergy symptoms typically occur within minutes. For some people, symptoms can build up and become most severe 8 to 12 hours after contact with the animal. People with severe allergies can experience reactions in public places if dander has been transported on the pet owners' clothing. Marland Kitchen Keeping an animal outdoors is only a partial solution, since homes with pets in the yard still have higher concentrations of animal allergens. . Before getting a pet, ask your allergist to determine if you are allergic to animals. If your pet is already considered part of your family, try to minimize contact and keep the pet out of the bedroom and other rooms where you spend a great deal of time. . As with dust mites, vacuum carpets often or replace carpet with a hardwood floor, tile or linoleum. . High-efficiency particulate air (HEPA) cleaners can reduce allergen levels over  time. . While dander and saliva are the source of cat and dog allergens, urine is the source of allergens from rabbits, hamsters, mice and [place] pigs; so ask a non-allergic family member to clean the animal's cage. . If you have a pet allergy, talk to your allergist about the potential for allergy immunotherapy (allergy shots). This strategy can often provide long-term relief.  Skin care recommendations  Bath time: . Always use lukewarm water. AVOID very hot or cold water. Marland Kitchen Keep bathing time to 5-10 minutes. . Do NOT use bubble bath. . Use a mild soap and use just enough to wash the dirty areas. . Do NOT scrub skin vigorously.  . After bathing, pat dry your skin with a towel. Do NOT rub or scrub the skin.  Moisturizers and prescriptions:  . ALWAYS apply moisturizers immediately after bathing (within 3 minutes). This helps to lock-in moisture. . Use the moisturizer several times a day over the whole body. Peri Jefferson summer moisturizers include: Aveeno, CeraVe, Cetaphil. Peri Jefferson winter moisturizers include: Aquaphor, Vaseline, Cerave, Cetaphil, Eucerin, Vanicream. . When using moisturizers along with medications, the moisturizer should be applied about one hour after applying the medication to prevent diluting effect of the medication or moisturize around where you applied  the medications. When not using medications, the moisturizer can be continued twice daily as maintenance.  Laundry and clothing: . Avoid laundry products with added color or perfumes. . Use unscented hypo-allergenic laundry products such as Tide free, Cheer free & gentle, and All free and clear.  . If the skin still seems dry or sensitive, you can try double-rinsing the clothes. . Avoid tight or scratchy clothing such as wool. . Do not use fabric softeners or dyer sheets.

## 2020-06-22 NOTE — Assessment & Plan Note (Signed)
   Avoid seafood seasoning.   For mild symptoms you can take over the counter antihistamines such as Benadryl and monitor symptoms closely. If symptoms worsen or if you have severe symptoms including breathing issues, throat closure, significant swelling, whole body hives, severe diarrhea and vomiting, lightheadedness then inject epinephrine and seek immediate medical care afterwards.

## 2020-06-22 NOTE — Progress Notes (Signed)
Follow Up Note  RE: Valerie [name] MRN: [id number] DOB: 1975-05-19 Date of Office Visit: 06/22/2020  Referring provider: Burnis Medin, * Primary care provider: Burnis Medin, [place]  Chief Complaint: Asthma  History of Present Illness: I saw Valerie [name] for a follow up visit at the [facility] on 06/22/2020. She is a 45 y.o. female, who is being followed for asthma, allergic rhinitis, atopic dermatitis, adverse food reaction. Her previous allergy office visit was on 02/01/2019 with Dr. Beaulah Dinning. Today is a new complaint visit of not feeling well. Patient failed to follow up as recommended.   Asthma: Noted coughing due to throat irritation, wheezing, shortness of breath and chest tightness for 4 days. Denies any fevers but had some chills. No sick contacts.  Patient states that she did an at home test which was negative for covid-19 yesterday.   Using albuterol HFA and nebulizer every 4-6 hours with some benefit. Using Arnuity 2 puffs twice a day for this week - no recent spirometry.  Denies any ER/urgent care visits or prednisone use since the last visit.  Rhinitis: Currently using Flonase 2 sprays twice per day. Taking Xyzal daily.   Noted some green/yellow mucous.  Not as much sinus pressure.  No recent antibiotics but she wants a zpak because she wants to get "ahead of this" and she "knows her body" and she has been "doing this for 40+ years". She also gets a yeast infection with antibiotics and wants diflucan as well.   Eczema: Using triamcinolone/Eucerin once a day as moisturizer for many years. When she tries to stop the eczema spots return.  Tried to use OTC lotion with no benefit.  Reflux: Takes Protonix daily with good benefit.   Food allergies: Currently avoiding seafood seasoning. No issues with shellfish or finned fish.   Assessment and Plan: Valerie [name] is a 45 y.o. female with: Upper respiratory tract infection 4 day of  symptoms with no fevers. Negative at home covid-19 testing per patient. Requesting antibiotics and diflucan as she gets yeast infections.  Discussed at length that most URIs are caused by viruses rather than bacteria and typically improve on its own over 7-10 days.   Patient is insistent that she knows her body and she needs antibiotics. She also gets yeast infections with antibiotics.   Advised patient to do symptomatic management and start prednisone for the asthma flare first rather starting the antibiotics which apparently gives her yeast infections.  Sent in Rx for antibiotics and diflucan and asked pharmacy to put on hold so patient can start with above regimen first and if no improvement within the next few days then to star the antibiotics.   Moderate persistent asthma with acute exacerbation Patient has been using Arnuity 2 puffs BID and albuterol daily with some benefit. No recent prednisone. Negative at home covid-19 testing. Start prednisone taper.   Take Breo 1 puff daily and rinse mouth after each use for 2 weeks.   Then resume Arnuity 1 puff daily as maintenance.  . Daily controller medication(s): Arnuity 1 puff once a day and rinse mouth after each use.  . May use albuterol rescue inhaler 2 puffs every 4 to 6 hours as needed for shortness of breath, chest tightness, coughing, and wheezing. May use albuterol rescue inhaler 2 puffs 5 to 15 minutes prior to strenuous physical activities. Monitor frequency of use.  . Will get spirometry at next visit instead of today due  to COVID-19 pandemic and trying to minimize any type of aerosolizing procedures at this time in the office.  Other atopic dermatitis  Continue proper skin care.  Try to limit steroid use but patient states only daily use helps as she knows her body. Discussed side effects of long term topical steroid creams.   Other adverse food reactions, not elsewhere classified, subsequent  encounter  Avoid seafood seasoning.   For mild symptoms you can take over the counter antihistamines such as Benadryl and monitor symptoms closely. If symptoms worsen or if you have severe symptoms including breathing issues, throat closure, significant swelling, whole body hives, severe diarrhea and vomiting, lightheadedness then inject epinephrine and seek immediate medical care afterwards.  Other allergic rhinitis Past history - 2015 skin testing positive to dust mites, cat, dog.  Continue environmental control measures.  May use Flonase (fluticasone) nasal spray 1 spray per nostril twice a day as needed for nasal congestion.   May use over the counter antihistamines such as Zyrtec (cetirizine), Claritin (loratadine), Allegra (fexofenadine), or Xyzal (levocetirizine) daily as needed.  Return in about 4 months (around 10/23/2020).  Meds ordered this encounter  Medications  . azithromycin (ZITHROMAX Z-PAK) 250 MG tablet    Sig: Take 2 tablets on day 1, then 1 tablet daily from day 2-5.    Dispense:  6 tablet    Refill:  0    Hold Rx until patient ready to pick up.  . fluconazole (DIFLUCAN) 150 MG tablet    Sig: Take 1 tablet (150 mg total) by mouth daily. For yeast infection    Dispense:  1 tablet    Refill:  0    Hold Rx until patient ready to pick up.  . fluticasone furoate-vilanterol (BREO ELLIPTA) 200-25 MCG/INH AEPB    Sig: Inhale 1 puff into the lungs daily. Take for 2 weeks during upper respiratory infections. Rinse mouth after each use.    Dispense:  60 each    Refill:  0   Lab Orders  No laboratory test(s) ordered today    Diagnostics: None.  Medication List:  Current Outpatient Medications  Medication Sig Dispense Refill  . albuterol (PROAIR HFA) 108 (90 Base) MCG/ACT inhaler 2 puffs every 4 hours as needed or 5-15 minutes before exercise 18 g 1  . albuterol (PROVENTIL) (2.5 MG/3ML) 0.083% nebulizer solution Take 2.5 mg by nebulization every 6 (six) hours as  needed for wheezing or shortness of breath.    Marland Kitchen azithromycin (ZITHROMAX Z-PAK) 250 MG tablet Take 2 tablets on day 1, then 1 tablet daily from day 2-5. 6 tablet 0  . buPROPion (WELLBUTRIN XL) 150 MG 24 hr tablet Take 150 mg by mouth daily.    . clonazePAM (KLONOPIN) 1 MG tablet Take 1 mg by mouth 2 (two) times daily.    Lennox Solders (EUCRISA) 2 % OINT Apply 1 application topically 2 (two) times daily. 60 g 3  . diphenhydrAMINE (BENADRYL) 25 MG tablet Take 50 mg by mouth.    . EPINEPHrine (AUVI-Q) 0.3 mg/0.3 mL IJ SOAJ injection Use as directed for severe allergic reaction 2 each 1  . fluconazole (DIFLUCAN) 150 MG tablet Take 1 tablet (150 mg total) by mouth daily. For yeast infection 1 tablet 0  . fluticasone (FLONASE) 50 MCG/ACT nasal spray USE 2 SPRAY(S) IN EACH NOSTRIL ONCE DAILY FOR  STUFFY  NOSE 16 g 0  . Fluticasone Furoate (ARNUITY ELLIPTA) 100 MCG/ACT AEPB Inhale 1 puff into the lungs daily. 30 each 5  .  fluticasone furoate-vilanterol (BREO ELLIPTA) 200-25 MCG/INH AEPB Inhale 1 puff into the lungs daily. Take for 2 weeks during upper respiratory infections. Rinse mouth after each use. 60 each 0  . ibuprofen (ADVIL,MOTRIN) 800 MG tablet Take 1 tablet by mouth every 8 (eight) hours as needed. pain    . levocetirizine (XYZAL) 5 MG tablet Take 5 mg by mouth every evening.    . pantoprazole (PROTONIX) 40 MG tablet Take 1 tablet by mouth  daily Need appointment for  refills    . promethazine (PHENERGAN) 25 MG tablet Take 25 mg by mouth.    . SM MULTIPLE VITAMINS/IRON TABS Take by mouth.    . triamcinolone ointment (KENALOG) 0.1 % APPLY OINTMENT TOPICALLY TWICE DAILY TO RED ITCHY AREA BELOW FACE AND NECK 454 g 3  . levocetirizine (XYZAL) 5 MG tablet Take 5 mg by mouth.     No current facility-administered medications for this visit.   Allergies: Allergies  Allergen Reactions  . Gabapentin Nausea And Vomiting  . Other Nausea And Vomiting    Was caused by some type of anesthesia  .  Lactose Diarrhea  . Lactose Intolerance (Gi)     Stomach pain and diarrhea   . Lidocaine Nausea And Vomiting  . Molds & Smuts Other (See Comments)    Flares asthma   . Oxycodone-Acetaminophen Itching    Takes benadryl with medication  . Percocet [Oxycodone-Acetaminophen] Itching    Takes benadryl with medication   I reviewed her past medical history, social history, family history, and environmental history and no significant changes have been reported from her previous visit.  Review of Systems  Constitutional: Positive for chills. Negative for appetite change, fever and unexpected weight change.  HENT: Positive for congestion, postnasal drip and sinus pressure. Negative for rhinorrhea.   Eyes: Negative for itching.  Respiratory: Positive for cough and wheezing. Negative for chest tightness and shortness of breath.   Gastrointestinal: Negative for abdominal pain.  Skin: Negative for rash.  Allergic/Immunologic: Positive for environmental allergies.  Neurological: Negative for headaches.   Objective: BP 118/72   Pulse (!) 105   Resp 16   Ht 5' 1.5" (1.562 m)   Wt 168 lb 12.8 oz (76.6 kg)   SpO2 98%   BMI 31.38 kg/m  Body mass index is 31.38 kg/m. Physical Exam Vitals and nursing note reviewed.  Constitutional:      Appearance: Normal appearance. She is well-developed.  HENT:     Head: Normocephalic and atraumatic.     Right Ear: External ear normal. There is impacted cerumen.     Left Ear: External ear normal. There is impacted cerumen.     Nose: Nose normal.     Mouth/Throat:     Mouth: Mucous membranes are moist.     Pharynx: Oropharynx is clear.  Eyes:     Conjunctiva/sclera: Conjunctivae normal.  Cardiovascular:     Rate and Rhythm: Normal rate and regular rhythm.     Heart sounds: Normal heart sounds. No murmur heard.   Pulmonary:     Effort: Pulmonary effort is normal.     Breath sounds: Normal breath sounds. No wheezing, rhonchi or rales.   Musculoskeletal:     Cervical back: Neck supple.  Skin:    General: Skin is warm.     Findings: No rash.  Neurological:     Mental Status: She is alert and oriented to person, place, and time.  Psychiatric:        Behavior: Behavior normal.  Previous notes and tests were reviewed. The plan was reviewed with the patient/family, and all questions/concerned were addressed.  It was my pleasure to see Valerie [name] today and participate in her care. Please feel free to contact me with any questions or concerns.  Sincerely,  Wyline Mood, DO Allergy & Immunology  [facility] office: [phone] [facility] office: [phone]

## 2020-06-25 ENCOUNTER — Ambulatory Visit: Payer: BC Managed Care – PPO | Admitting: Family Medicine

## 2020-10-23 NOTE — Progress Notes (Deleted)
Follow Up Note  RE: [name] MRN: [id number] DOB: [birth year] Date of Office Visit: 10/24/2020  Referring provider: Burnis Medin, * Primary care provider: Burnis Medin, [place]  Chief Complaint: No chief complaint on file.  History of Present Illness: I had the pleasure of seeing Valerie [name] for a follow up visit at the [facility] on 10/23/2020. She is a 45 y.o. female, who is being followed for asthma, atopic dermatitis, adverse food reaction and allergic rhinitis. Her previous allergy office visit was on 06/22/2020 with Dr. Selena Batten. Today is a regular follow up visit.  Upper respiratory tract infection 4 day of symptoms with no fevers. Negative at home covid-19 testing per patient. Requesting antibiotics and diflucan as she gets yeast infections. Discussed at length that most URIs are caused by viruses rather than bacteria and typically improve on its own over 7-10 days.  Patient is insistent that she knows her body and she needs antibiotics. She also gets yeast infections with antibiotics.  Advised patient to do symptomatic management and start prednisone for the asthma flare first rather starting the antibiotics which apparently gives her yeast infections. Sent in Rx for antibiotics and diflucan and asked pharmacy to put on hold so patient can start with above regimen first and if no improvement within the next few days then to star the antibiotics.    Moderate persistent asthma with acute exacerbation Patient has been using Arnuity 2 puffs BID and albuterol daily with some benefit. No recent prednisone. Negative at home covid-19 testing. Start prednisone taper.  Take Breo 1 puff daily and rinse mouth after each use for 2 weeks.  Then resume Arnuity 1 puff daily as maintenance.  Daily controller medication(s): Arnuity 1 puff once a day and rinse mouth after each use.  May use albuterol rescue inhaler 2 puffs every 4 to 6  hours as needed for shortness of breath, chest tightness, coughing, and wheezing. May use albuterol rescue inhaler 2 puffs 5 to 15 minutes prior to strenuous physical activities. Monitor frequency of use.  Will get spirometry at next visit instead of today due to COVID-19 pandemic and trying to minimize any type of aerosolizing procedures at this time in the office.   Other atopic dermatitis Continue proper skin care. Try to limit steroid use but patient states only daily use helps as she knows her body. Discussed side effects of long term topical steroid creams.    Other adverse food reactions, not elsewhere classified, subsequent encounter Avoid seafood seasoning.  For mild symptoms you can take over the counter antihistamines such as Benadryl and monitor symptoms closely. If symptoms worsen or if you have severe symptoms including breathing issues, throat closure, significant swelling, whole body hives, severe diarrhea and vomiting, lightheadedness then inject epinephrine and seek immediate medical care afterwards.   Other allergic rhinitis Past history - 2015 skin testing positive to dust mites, cat, dog. Continue environmental control measures. May use Flonase (fluticasone) nasal spray 1 spray per nostril twice a day as needed for nasal congestion.  May use over the counter antihistamines such as Zyrtec (cetirizine), Claritin (loratadine), Allegra (fexofenadine), or Xyzal (levocetirizine) daily as needed.   Return in about 4 months (around 10/23/2020).  Assessment and Plan: [name] is a 45 y.o. female with: No problem-specific Assessment & Plan notes found for this encounter.  No follow-ups on file.  No orders of the defined types were placed in this encounter.  Lab Orders  No laboratory test(s) ordered today    Diagnostics: Spirometry:  Tracings reviewed. Her effort: {Blank single:19197::"Good reproducible efforts.","It was hard to get consistent efforts and there is a question as  to whether this reflects a maximal maneuver.","Poor effort, data can not be interpreted."} FVC: ***L FEV1: ***L, ***% predicted FEV1/FVC ratio: ***% Interpretation: {Blank single:19197::"Spirometry consistent with mild obstructive disease","Spirometry consistent with moderate obstructive disease","Spirometry consistent with severe obstructive disease","Spirometry consistent with possible restrictive disease","Spirometry consistent with mixed obstructive and restrictive disease","Spirometry uninterpretable due to technique","Spirometry consistent with normal pattern","No overt abnormalities noted given today's efforts"}.  Please see scanned spirometry results for details.  Skin Testing: {Blank single:19197::"Select foods","Environmental allergy panel","Environmental allergy panel and select foods","Food allergy panel","None","Deferred due to recent antihistamines use"}. *** Results discussed with patient/family.   Medication List:  Current Outpatient Medications  Medication Sig Dispense Refill  . albuterol (PROAIR HFA) 108 (90 Base) MCG/ACT inhaler 2 puffs every 4 hours as needed or 5-15 minutes before exercise 18 g 1  . albuterol (PROVENTIL) (2.5 MG/3ML) 0.083% nebulizer solution Take 2.5 mg by nebulization every 6 (six) hours as needed for wheezing or shortness of breath.    Marland Kitchen azithromycin (ZITHROMAX Z-PAK) 250 MG tablet Take 2 tablets on day 1, then 1 tablet daily from day 2-5. 6 tablet 0  . buPROPion (WELLBUTRIN XL) 150 MG 24 hr tablet Take 150 mg by mouth daily.    . clonazePAM (KLONOPIN) 1 MG tablet Take 1 mg by mouth 2 (two) times daily.    Lennox Solders (EUCRISA) 2 % OINT Apply 1 application topically 2 (two) times daily. 60 g 3  . diphenhydrAMINE (BENADRYL) 25 MG tablet Take 50 mg by mouth.    . EPINEPHrine (AUVI-Q) 0.3 mg/0.3 mL IJ SOAJ injection Use as directed for severe allergic reaction 2 each 1  . fluconazole (DIFLUCAN) 150 MG tablet Take 1 tablet (150 mg total) by mouth daily.  For yeast infection 1 tablet 0  . fluticasone (FLONASE) 50 MCG/ACT nasal spray USE 2 SPRAY(S) IN EACH NOSTRIL ONCE DAILY FOR  STUFFY  NOSE 16 g 0  . Fluticasone Furoate (ARNUITY ELLIPTA) 100 MCG/ACT AEPB Inhale 1 puff into the lungs daily. 30 each 5  . fluticasone furoate-vilanterol (BREO ELLIPTA) 200-25 MCG/INH AEPB Inhale 1 puff into the lungs daily. Take for 2 weeks during upper respiratory infections. Rinse mouth after each use. 60 each 0  . ibuprofen (ADVIL,MOTRIN) 800 MG tablet Take 1 tablet by mouth every 8 (eight) hours as needed. pain    . levocetirizine (XYZAL) 5 MG tablet Take 5 mg by mouth.    . levocetirizine (XYZAL) 5 MG tablet Take 5 mg by mouth every evening.    . pantoprazole (PROTONIX) 40 MG tablet Take 1 tablet by mouth  daily Need appointment for  refills    . promethazine (PHENERGAN) 25 MG tablet Take 25 mg by mouth.    . SM MULTIPLE VITAMINS/IRON TABS Take by mouth.    . triamcinolone ointment (KENALOG) 0.1 % APPLY OINTMENT TOPICALLY TWICE DAILY TO RED ITCHY AREA BELOW FACE AND NECK 454 g 3   No current facility-administered medications for this visit.   Allergies: Allergies  Allergen Reactions  . Gabapentin Nausea And Vomiting  . Other Nausea And Vomiting    Was caused by some type of anesthesia  . Lactose Diarrhea  . Lactose Intolerance (Gi)     Stomach pain and diarrhea   . Lidocaine Nausea And Vomiting  . Molds & Smuts Other (See Comments)  Flares asthma   . Oxycodone-Acetaminophen Itching    Takes benadryl with medication  . Percocet [Oxycodone-Acetaminophen] Itching    Takes benadryl with medication   I reviewed her past medical history, social history, family history, and environmental history and no significant changes have been reported from her previous visit.  Review of Systems  Constitutional:  Positive for chills. Negative for appetite change, fever and unexpected weight change.  HENT:  Positive for congestion, postnasal drip and sinus  pressure. Negative for rhinorrhea.   Eyes:  Negative for itching.  Respiratory:  Positive for cough and wheezing. Negative for chest tightness and shortness of breath.   Gastrointestinal:  Negative for abdominal pain.  Skin:  Negative for rash.  Allergic/Immunologic: Positive for environmental allergies.  Neurological:  Negative for headaches.   Objective: There were no vitals taken for this visit. There is no height or weight on file to calculate BMI. Physical Exam Vitals and nursing note reviewed.  Constitutional:      Appearance: Normal appearance. She is well-developed.  HENT:     Head: Normocephalic and atraumatic.     Right Ear: External ear normal. There is impacted cerumen.     Left Ear: External ear normal. There is impacted cerumen.     Nose: Nose normal.     Mouth/Throat:     Mouth: Mucous membranes are moist.     Pharynx: Oropharynx is clear.  Eyes:     Conjunctiva/sclera: Conjunctivae normal.  Cardiovascular:     Rate and Rhythm: Normal rate and regular rhythm.     Heart sounds: Normal heart sounds. No murmur heard. Pulmonary:     Effort: Pulmonary effort is normal.     Breath sounds: Normal breath sounds. No wheezing, rhonchi or rales.  Musculoskeletal:     Cervical back: Neck supple.  Skin:    General: Skin is warm.     Findings: No rash.  Neurological:     Mental Status: She is alert and oriented to person, place, and time.  Psychiatric:        Behavior: Behavior normal.  Previous notes and tests were reviewed. The plan was reviewed with the patient/family, and all questions/concerned were addressed.  It was my pleasure to see Lanore today and participate in her care. Please feel free to contact me with any questions or concerns.  Sincerely,  Wyline Mood, DO Allergy & Immunology  [facility] office: [phone] [facility] office: [phone]

## 2020-10-24 ENCOUNTER — Ambulatory Visit: Admitting: Allergy

## 2020-10-24 DIAGNOSIS — J3089 Other allergic rhinitis: Secondary | ICD-10-CM

## 2020-10-24 DIAGNOSIS — L2089 Other atopic dermatitis: Secondary | ICD-10-CM

## 2020-10-24 DIAGNOSIS — T781XXD Other adverse food reactions, not elsewhere classified, subsequent encounter: Secondary | ICD-10-CM

## 2023-01-26 NOTE — Progress Notes (Unsigned)
   [address] [place] [address] Dept: [phone]  FOLLOW UP NOTE  Patient ID: Valerie [name], female    DOB: [birth year]  Age: 47 y.o. MRN: [id number] Date of Office Visit: 01/27/2023  Assessment  Chief Complaint: No chief complaint on file.  HPI Valerie [name] is a 47 year old female who presents to the clinic for follow-up visit.  She was last seen in this clinic on 06/22/2020 by Dr. Selena Batten for evaluation of upper respiratory infection requiring azithromycin and prednisolone, asthma, allergic rhinitis, and atopic dermatitis.  Her last environmental allergy skin testing was on 01/31/2014 it was positive to dust mite, cat, and dog.  Discussed the use of AI scribe software for clinical note transcription with the patient, who gave verbal consent to proceed.  History of Present Illness             Drug Allergies:  Allergies  Allergen Reactions   Gabapentin Nausea And Vomiting   Other Nausea And Vomiting    Was caused by some type of anesthesia   Lactose Diarrhea   Lactose Intolerance (Gi)     Stomach pain and diarrhea    Lidocaine Nausea And Vomiting   Molds & Smuts Other (See Comments)    Flares asthma    Oxycodone-Acetaminophen Itching    Takes benadryl with medication   Percocet [Oxycodone-Acetaminophen] Itching    Takes benadryl with medication    Physical Exam: There were no vitals taken for this visit.   Physical Exam  Diagnostics:    Assessment and Plan: No diagnosis found.  No orders of the defined types were placed in this encounter.   There are no Patient Instructions on file for this visit.  No follow-ups on file.    Thank you for the opportunity to care for this patient.  Please do not hesitate to contact me with questions.  Thermon Leyland, FNP [facility]

## 2023-01-26 NOTE — Patient Instructions (Incomplete)
Asthma Continue albuterol 2 puffs every 4 hours as needed for cough or wheeze OR Instead use albuterol 0.083% solution via nebulizer one unit vial every 4 hours as needed for cough or wheeze  Allergic rhinitis Continue allergen avoidance measures directed toward dust mite, cat, and dog as listed below Continue an antihistamine once a day as needed for runny nose or itch Continue Flonase 2 sprays in each nostril once a day as needed for stuffy nose Consider saline nasal rinses as needed for nasal symptoms. Use this before any medicated nasal sprays for best result Consider updating your environmental allergy testing.  Remember to stop antihistamines for 3 days before your testing appointment  Atopic dermatitis Continue a twice a day moisturizing routine Continue Eucrisa to red and itchy areas up to twice a day if needed For stubborn red itchy areas below your face, continue triamcinolone up to twice a day as needed.  Do not use this medication for longer than 2 weeks in a row.  Call the clinic if this treatment plan is not working well for you.  Follow up in *** or sooner if needed.  Control of Mold Allergen Mold and fungi can grow on a variety of surfaces provided certain temperature and moisture conditions exist.  Outdoor molds grow on plants, decaying vegetation and soil.  The major outdoor mold, Alternaria and Cladosporium, are found in very high numbers during hot and dry conditions.  Generally, a late Summer - Fall peak is seen for common outdoor fungal spores.  Rain will temporarily lower outdoor mold spore count, but counts rise rapidly when the rainy period ends.  The most important indoor molds are Aspergillus and Penicillium.  Dark, humid and poorly ventilated basements are ideal sites for mold growth.  The next most common sites of mold growth are the bathroom and the kitchen.  Outdoor [employer] Use air conditioning and keep windows closed Avoid exposure to decaying  vegetation. Avoid leaf raking. Avoid grain handling. Consider wearing a face mask if working in moldy areas.  Indoor Mold Control Maintain humidity below 50%. Clean washable surfaces with 5% bleach solution. Remove sources e.g. Contaminated carpets.  Control of Dog or Cat Allergen Avoidance is the best way to manage a dog or cat allergy. If you have a dog or cat and are allergic to dog or cats, consider removing the dog or cat from the home. If you have a dog or cat but don't want to find it a new home, or if your family wants a pet even though someone in the household is allergic, here are some strategies that may help keep symptoms at bay:  Keep the pet out of your bedroom and restrict it to only a few rooms. Be advised that keeping the dog or cat in only one room will not limit the allergens to that room. Don't pet, hug or kiss the dog or cat; if you do, wash your hands with soap and water. High-efficiency particulate air (HEPA) cleaners run continuously in a bedroom or living room can reduce allergen levels over time. Regular use of a high-efficiency vacuum cleaner or a central vacuum can reduce allergen levels. Giving your dog or cat a bath at least once a week can reduce airborne allergen.

## 2023-01-27 ENCOUNTER — Ambulatory Visit (INDEPENDENT_AMBULATORY_CARE_PROVIDER_SITE_OTHER): Payer: Managed Care, Other (non HMO) | Admitting: Internal Medicine

## 2023-01-27 ENCOUNTER — Encounter: Payer: Self-pay | Admitting: Internal Medicine

## 2023-01-27 ENCOUNTER — Ambulatory Visit: Admitting: Family Medicine

## 2023-01-27 ENCOUNTER — Other Ambulatory Visit: Payer: Self-pay

## 2023-01-27 VITALS — BP 118/84 | HR 100 | Temp 98.2°F | Ht 61.5 in | Wt 175.0 lb

## 2023-01-27 DIAGNOSIS — J4530 Mild persistent asthma, uncomplicated: Secondary | ICD-10-CM | POA: Diagnosis not present

## 2023-01-27 DIAGNOSIS — J069 Acute upper respiratory infection, unspecified: Secondary | ICD-10-CM

## 2023-01-27 DIAGNOSIS — L2089 Other atopic dermatitis: Secondary | ICD-10-CM

## 2023-01-27 DIAGNOSIS — J3089 Other allergic rhinitis: Secondary | ICD-10-CM | POA: Diagnosis not present

## 2023-01-27 DIAGNOSIS — T781XXD Other adverse food reactions, not elsewhere classified, subsequent encounter: Secondary | ICD-10-CM

## 2023-01-27 MED ORDER — ALBUTEROL SULFATE HFA 108 (90 BASE) MCG/ACT IN AERS: 1.0000 | INHALATION_SPRAY | Freq: Four times a day (QID) | RESPIRATORY_TRACT | 1 refills | Status: DC | PRN

## 2023-01-27 MED ORDER — FLUCONAZOLE 150 MG PO TABS
150.0000 mg | ORAL_TABLET | Freq: Once | ORAL | 0 refills | Status: AC
Start: 2023-01-27 — End: 2023-01-27

## 2023-01-27 MED ORDER — AZITHROMYCIN 250 MG PO TABS: ORAL_TABLET | ORAL | 0 refills | Status: DC

## 2023-01-27 MED ORDER — ASMANEX HFA 100 MCG/ACT IN AERO: 2.0000 | INHALATION_SPRAY | Freq: Two times a day (BID) | RESPIRATORY_TRACT | 5 refills | Status: DC

## 2023-01-27 MED ORDER — ALBUTEROL SULFATE (2.5 MG/3ML) 0.083% IN NEBU: 2.5000 mg | INHALATION_SOLUTION | Freq: Four times a day (QID) | RESPIRATORY_TRACT | 1 refills | Status: AC | PRN

## 2023-01-27 NOTE — Patient Instructions (Addendum)
Upper Respiratory Infection - Believe this is a viral infection, not bacterial.  - If having worsening symptoms after 3-5 days, start Azithromycin 500mg  x1 day and 250mg  x4 days.  Will send in Diflucan 150mg  x1 dose to use if you develop a yeast infection.   Mild Persistent Asthma  - Maintenance inhaler: start Asmanex 2 puffs twice daily.  - Rescue inhaler: Albuterol 2 puffs or 1 vial via nebulizer every 4-6 hours as needed for respiratory symptoms of cough, shortness of breath, or wheezing Asthma control goals:  Full participation in all desired activities (may need albuterol before activity) Albuterol use two times or less a week on average (not counting use with activity) Cough interfering with sleep two times or less a month Oral steroids no more than once a year No hospitalizations    Other adverse food reactions - Avoid seafood seasoning.  - for SKIN only reaction, okay to take Benadryl 25mg  capsules every 6 hours as needed - for SKIN + ANY additional symptoms (breathing issues, throat closure, significant swelling, whole body hives, severe diarrhea and vomiting, lightheadedness), OR IF concern for LIFE THREATENING reaction = Epipen Autoinjector EpiPen 0.3 mg. - If using Epinephrine autoinjector, call 911 or go to the ER.    Allergic rhinitis - SPT 2015: positive to dust mite, cat, and dog  - Continue Xyzal 5mg  daily as needed for runny nose or itch - Continue Nasacort 2 sprays in each nostril once a day as needed for stuffy nose - Consider saline nasal rinses as needed for nasal symptoms. Use this before any medicated nasal sprays for best result  Atopic dermatitis - Do a daily soaking tub bath in warm water for 10-15 minutes.  - Use a gentle, unscented cleanser at the end of the bath (such as Dove unscented bar or baby wash, or Aveeno sensitive body wash). Then rinse, pat half-way dry, and apply a gentle, unscented moisturizer cream or ointment (Cerave, Cetaphil, Eucerin,  Aveeno, Aquaphor, Vaseline)  all over while still damp. Dry skin makes the itching and rash of eczema worse. The skin should be moisturized with a gentle, unscented moisturizer at least twice daily.  - Use only unscented liquid laundry detergent. - Apply prescribed topical steroid (triamcinolone 0.1% below neck) to flared areas (red and thickened eczema) after the moisturizer has soaked into the skin (wait at least 30 minutes). Taper off the topical steroids as the skin improves. Do not use topical steroid for more than 7-10 days at a time.  - Put Eucrisa onto areas of rough eczema twice a day. May decrease to once a day as the eczema improves. This will not thin the skin, and is safe for chronic use. Do not put this onto normal appearing skin.

## 2023-01-27 NOTE — Progress Notes (Signed)
FOLLOW UP Date of Service/Encounter:  01/27/23   Subjective:  Valerie [name] (DOB: 18-Aug-1975) is a 47 y.o. female who [facility] for follow up for an acute visit.  Has not been seen since 2022, cancelled appointment 06/2020 and no showed to 10/2020.    History obtained from: chart review and patient. Last seen 06/22/2020 with Dr Selena Batten. At the time, discussed likely a viral URI but patient insistent on abx/diflucan for yeas infection. Asthma was also uncontrolled so started on prednisone taper and Breo for 2 weeks and then Arnuity. Eczema on topical steroids and Eucrisa.  Avoiding seafood seasoning (can eat seafood), has an Epipen.  Also with allergic rhinitis on Flonase/anti histamines PRN.   Reports onset about 1 week ago.  Started with sore throat and now progressed to congestion, drainage, green/yellow mucous, sinus pressure.  COVID negative at home.  Using Xyzal and Nasacort.  Has also noted some shortness of breath and wheezing.  Has Albuterol PRN but not on any controller medication.  Usually gets a yeast infection with abx use also. Unable to do topical antifungals due to burning.   Notes eczema is up and down.  Does tend to flare up frequently on arms/legs.  Uses triamcinolone and Eucrisa PRN.  Moisturizing with Aquaphor.   Past Medical History: Past Medical History:  Diagnosis Date   Arthritis    Asthma    H/O degenerative disc disease     Objective:  BP 118/84   Pulse 100   Temp 98.2 F (36.8 C) (Temporal)   Ht 5' 1.5" (1.562 m)   Wt 175 lb (79.4 kg)   SpO2 98%   BMI 32.53 kg/m  Body mass index is 32.53 kg/m. Physical Exam: GEN: alert, well developed HEENT: clear conjunctiva, nose with mild inferior turbinate hypertrophy, pink nasal mucosa, clear rhinorrhea, no cobblestoning HEART: regular rate and rhythm, no murmur LUNGS: clear to auscultation bilaterally, no coughing, unlabored respiration SKIN: dry skin, no active  eczematous patches    Spirometry:  Tracings reviewed. Her effort: Good reproducible efforts. FVC: 2.24L, 82% predicted  FEV1: 1.6L, 71% predicted FEV1/FVC ratio: 71% Interpretation: Spirometry consistent with normal pattern.  Please see scanned spirometry results for details.   Assessment:   1. Viral URI with cough   2. Perennial allergic rhinitis   3. Mild persistent asthma without complication   4. Other atopic dermatitis   5. Other adverse food reactions, not elsewhere classified, subsequent encounter     Plan/Recommendations:  Upper Respiratory Infection - Believe this is a viral infection, not bacterial. She reported multiple times that she knows her body best and believes she needs the antibiotics.  We discussed a middle ground to wait a few days prior to starting it.  - If having worsening symptoms after 3-5 days, start Azithromycin 500mg  x1 day and 250mg  x4 days.  Will send in Diflucan 150mg  x1 dose to use if you develop a yeast infection.   Mild persistent asthma  - MDI technique discussed.  Spirometry today was normal. No need for prednisone. Restart ICS inhaler. Also discussed with her asthma, needs routine follow up.  - Maintenance inhaler: start Asmanex 2 puffs twice daily.  - Rescue inhaler: Albuterol 2 puffs or 1 vial via nebulizer every 4-6 hours as needed for respiratory symptoms of cough, shortness of breath, or wheezing Asthma control goals:  Full participation in all desired activities (may need albuterol before activity) Albuterol use two times or less  a week on average (not counting use with activity) Cough interfering with sleep two times or less a month Oral steroids no more than once a year No hospitalizations    Other adverse food reactions - Avoid seafood seasoning.  - for SKIN only reaction, okay to take Benadryl 25mg  capsules every 6 hours as needed - for SKIN + ANY additional symptoms (breathing issues, throat closure, significant  swelling, whole body hives, severe diarrhea and vomiting, lightheadedness), OR IF concern for LIFE THREATENING reaction = Epipen Autoinjector EpiPen 0.3 mg. - If using Epinephrine autoinjector, call 911 or go to the ER.    Allergic rhinitis - SPT 2015: positive to dust mite, cat, and dog  - Continue Xyzal 5mg  daily as needed for runny nose or itch - Continue Nasacort 2 sprays in each nostril once a day as needed for stuffy nose - Consider saline nasal rinses as needed for nasal symptoms. Use this before any medicated nasal sprays for best result  Atopic dermatitis - Follow up with [facility]  - Do a daily soaking tub bath in warm water for 10-15 minutes.  - Use a gentle, unscented cleanser at the end of the bath (such as Dove unscented bar or baby wash, or Aveeno sensitive body wash). Then rinse, pat half-way dry, and apply a gentle, unscented moisturizer cream or ointment (Cerave, Cetaphil, Eucerin, Aveeno, Aquaphor, Vaseline)  all over while still damp. Dry skin makes the itching and rash of eczema worse. The skin should be moisturized with a gentle, unscented moisturizer at least twice daily.  - Use only unscented liquid laundry detergent. - Apply prescribed topical steroid (triamcinolone 0.1% below neck) to flared areas (red and thickened eczema) after the moisturizer has soaked into the skin (wait at least 30 minutes). Taper off the topical steroids as the skin improves. Do not use topical steroid for more than 7-10 days at a time.  - Put Eucrisa onto areas of rough eczema twice a day. May decrease to once a day as the eczema improves. This will not thin the skin, and is safe for chronic use. Do not put this onto normal appearing skin.      Return in about 6 months (around 07/28/2023).  Alesia Morin, MD [facility]

## 2023-03-17 ENCOUNTER — Other Ambulatory Visit: Payer: Self-pay | Admitting: Internal Medicine

## 2023-04-06 ENCOUNTER — Other Ambulatory Visit: Payer: Self-pay | Admitting: Internal Medicine

## 2023-04-22 ENCOUNTER — Encounter: Payer: Self-pay | Admitting: Internal Medicine

## 2023-04-22 ENCOUNTER — Other Ambulatory Visit: Payer: Self-pay | Admitting: Internal Medicine

## 2023-04-22 MED ORDER — LEVALBUTEROL HCL 0.63 MG/3ML IN NEBU: 0.6300 mg | INHALATION_SOLUTION | Freq: Four times a day (QID) | RESPIRATORY_TRACT | 1 refills | Status: AC | PRN

## 2023-04-22 NOTE — Progress Notes (Signed)
 Albuterol results in tremors, will try Xopenex.

## 2023-04-27 ENCOUNTER — Other Ambulatory Visit: Payer: Self-pay | Admitting: Internal Medicine

## 2023-05-19 ENCOUNTER — Other Ambulatory Visit: Payer: Self-pay | Admitting: Internal Medicine

## 2023-06-14 ENCOUNTER — Other Ambulatory Visit: Payer: Self-pay | Admitting: Internal Medicine

## 2023-06-25 ENCOUNTER — Encounter (HOSPITAL_COMMUNITY): Payer: Self-pay

## 2023-06-25 ENCOUNTER — Observation Stay (HOSPITAL_COMMUNITY)
Admission: EM | Admit: 2023-06-25 | Discharge: 2023-06-27 | Disposition: A | Discharge status: Home / Self Care | Attending: General Surgery | Admitting: General Surgery

## 2023-06-25 ENCOUNTER — Emergency Department (HOSPITAL_COMMUNITY)

## 2023-06-25 ENCOUNTER — Other Ambulatory Visit: Payer: Self-pay

## 2023-06-25 DIAGNOSIS — K353 Acute appendicitis with localized peritonitis, without perforation or gangrene: Secondary | ICD-10-CM | POA: Diagnosis not present

## 2023-06-25 DIAGNOSIS — J45909 Unspecified asthma, uncomplicated: Secondary | ICD-10-CM | POA: Insufficient documentation

## 2023-06-25 DIAGNOSIS — F419 Anxiety disorder, unspecified: Secondary | ICD-10-CM | POA: Diagnosis not present

## 2023-06-25 DIAGNOSIS — K3589 Other acute appendicitis without perforation or gangrene: Secondary | ICD-10-CM | POA: Diagnosis present

## 2023-06-25 DIAGNOSIS — F32A Depression, unspecified: Secondary | ICD-10-CM | POA: Insufficient documentation

## 2023-06-25 DIAGNOSIS — Z79899 Other long term (current) drug therapy: Secondary | ICD-10-CM | POA: Insufficient documentation

## 2023-06-25 DIAGNOSIS — R109 Unspecified abdominal pain: Secondary | ICD-10-CM | POA: Diagnosis present

## 2023-06-25 DIAGNOSIS — G4700 Insomnia, unspecified: Secondary | ICD-10-CM | POA: Diagnosis not present

## 2023-06-25 DIAGNOSIS — F411 Generalized anxiety disorder: Secondary | ICD-10-CM

## 2023-06-25 DIAGNOSIS — K219 Gastro-esophageal reflux disease without esophagitis: Secondary | ICD-10-CM | POA: Diagnosis not present

## 2023-06-25 DIAGNOSIS — K3580 Unspecified acute appendicitis: Secondary | ICD-10-CM | POA: Diagnosis present

## 2023-06-25 DIAGNOSIS — J4520 Mild intermittent asthma, uncomplicated: Secondary | ICD-10-CM | POA: Diagnosis present

## 2023-06-25 DIAGNOSIS — F909 Attention-deficit hyperactivity disorder, unspecified type: Secondary | ICD-10-CM | POA: Diagnosis not present

## 2023-06-25 DIAGNOSIS — Z8659 Personal history of other mental and behavioral disorders: Secondary | ICD-10-CM

## 2023-06-25 HISTORY — DX: Generalized anxiety disorder: F41.1

## 2023-06-25 LAB — CBC WITH DIFFERENTIAL/PLATELET
Abs Immature Granulocytes: 0.03 10*3/uL (ref 0.00–0.07)
Basophils Absolute: 0 10*3/uL (ref 0.0–0.1)
Basophils Relative: 0 %
Eosinophils Absolute: 0.1 10*3/uL (ref 0.0–0.5)
Eosinophils Relative: 1 %
HCT: 40.6 % (ref 36.0–46.0)
Hemoglobin: 13.5 g/dL (ref 12.0–15.0)
Immature Granulocytes: 0 %
Lymphocytes Relative: 26 %
Lymphs Abs: 2.5 10*3/uL (ref 0.7–4.0)
MCH: 29.2 pg (ref 26.0–34.0)
MCHC: 33.3 g/dL (ref 30.0–36.0)
MCV: 87.9 fL (ref 80.0–100.0)
Monocytes Absolute: 0.6 10*3/uL (ref 0.1–1.0)
Monocytes Relative: 6 %
Neutro Abs: 6.6 10*3/uL (ref 1.7–7.7)
Neutrophils Relative %: 67 %
Platelets: 475 10*3/uL — ABNORMAL HIGH (ref 150–400)
RBC: 4.62 MIL/uL (ref 3.87–5.11)
RDW: 13.8 % (ref 11.5–15.5)
WBC: 9.8 10*3/uL (ref 4.0–10.5)
nRBC: 0 % (ref 0.0–0.2)

## 2023-06-25 LAB — URINALYSIS, ROUTINE W REFLEX MICROSCOPIC
Bilirubin Urine: NEGATIVE
Glucose, UA: NEGATIVE mg/dL
Hgb urine dipstick: NEGATIVE
Ketones, ur: NEGATIVE mg/dL
Leukocytes,Ua: NEGATIVE
Nitrite: NEGATIVE
Protein, ur: NEGATIVE mg/dL
Specific Gravity, Urine: 1.003 — ABNORMAL LOW (ref 1.005–1.030)
pH: 6 (ref 5.0–8.0)

## 2023-06-25 LAB — COMPREHENSIVE METABOLIC PANEL WITH GFR
ALT: 18 U/L (ref 0–44)
AST: 22 U/L (ref 15–41)
Albumin: 3.7 g/dL (ref 3.5–5.0)
Alkaline Phosphatase: 55 U/L (ref 38–126)
Anion gap: 10 (ref 5–15)
BUN: 7 mg/dL (ref 6–20)
CO2: 24 mmol/L (ref 22–32)
Calcium: 9 mg/dL (ref 8.9–10.3)
Chloride: 100 mmol/L (ref 98–111)
Creatinine, Ser: 0.94 mg/dL (ref 0.44–1.00)
GFR, Estimated: >60 mL/min (ref >60)
Glucose, Bld: 82 mg/dL (ref 70–99)
Potassium: 3.3 mmol/L — ABNORMAL LOW (ref 3.5–5.1)
Sodium: 134 mmol/L — ABNORMAL LOW (ref 135–145)
Total Bilirubin: 1 mg/dL (ref 0.0–1.2)
Total Protein: 7.4 g/dL (ref 6.5–8.1)

## 2023-06-25 LAB — LIPASE, BLOOD: Lipase: 34 U/L (ref 11–51)

## 2023-06-25 LAB — HCG, QUANTITATIVE, PREGNANCY: hCG, Beta Chain, Quant, S: <1 m[IU]/mL (ref <5)

## 2023-06-25 MED ORDER — PROCHLORPERAZINE MALEATE 10 MG PO TABS: 10.0000 mg | ORAL_TABLET | Freq: Four times a day (QID) | ORAL | Status: DC | PRN

## 2023-06-25 MED ORDER — ALBUTEROL SULFATE (2.5 MG/3ML) 0.083% IN NEBU: 2.5000 mg | INHALATION_SOLUTION | Freq: Four times a day (QID) | RESPIRATORY_TRACT | Status: DC | PRN

## 2023-06-25 MED ORDER — ACETAMINOPHEN 325 MG PO TABS
650.0000 mg | ORAL_TABLET | Freq: Four times a day (QID) | ORAL | Status: DC | PRN
  Administered 2023-06-26: 650 mg via ORAL
  Filled 2023-06-25: qty 2

## 2023-06-25 MED ORDER — ENOXAPARIN SODIUM 40 MG/0.4ML IJ SOSY
40.0000 mg | PREFILLED_SYRINGE | Freq: Every day | INTRAMUSCULAR | Status: DC
  Administered 2023-06-25: 40 mg via SUBCUTANEOUS
  Filled 2023-06-25 (×2): qty 0.4

## 2023-06-25 MED ORDER — PROCHLORPERAZINE EDISYLATE 10 MG/2ML IJ SOLN: 5.0000 mg | Freq: Four times a day (QID) | INTRAMUSCULAR | Status: DC | PRN

## 2023-06-25 MED ORDER — HYDROMORPHONE HCL 1 MG/ML IJ SOLN
1.0000 mg | Freq: Once | INTRAMUSCULAR | Status: AC
  Administered 2023-06-25: 1 mg via INTRAVENOUS
  Filled 2023-06-25: qty 1

## 2023-06-25 MED ORDER — CRISABOROLE 2 % EX OINT: 1.0000 | TOPICAL_OINTMENT | Freq: Two times a day (BID) | CUTANEOUS | Status: DC

## 2023-06-25 MED ORDER — LEVOCETIRIZINE DIHYDROCHLORIDE 5 MG PO TABS: 5.0000 mg | ORAL_TABLET | Freq: Every evening | ORAL | Status: DC

## 2023-06-25 MED ORDER — EPINEPHRINE 0.3 MG/0.3ML IJ SOAJ: 0.3000 mg | Freq: Four times a day (QID) | INTRAMUSCULAR | Status: DC | PRN

## 2023-06-25 MED ORDER — BISACODYL 10 MG RE SUPP: 10.0000 mg | Freq: Two times a day (BID) | RECTAL | Status: DC | PRN

## 2023-06-25 MED ORDER — PANTOPRAZOLE SODIUM 40 MG PO TBEC
40.0000 mg | DELAYED_RELEASE_TABLET | Freq: Every day | ORAL | Status: DC
  Administered 2023-06-25 – 2023-06-27 (×3): 40 mg via ORAL
  Filled 2023-06-25 (×3): qty 1

## 2023-06-25 MED ORDER — BUDESONIDE 0.5 MG/2ML IN SUSP
0.5000 mg | Freq: Two times a day (BID) | RESPIRATORY_TRACT | Status: DC
  Administered 2023-06-26 – 2023-06-27 (×3): 0.5 mg via RESPIRATORY_TRACT
  Filled 2023-06-25 (×4): qty 2

## 2023-06-25 MED ORDER — SODIUM CHLORIDE 0.9 % IV SOLN
2.0000 g | Freq: Once | INTRAVENOUS | Status: DC
  Administered 2023-06-25: 2 g via INTRAVENOUS
  Filled 2023-06-25: qty 20

## 2023-06-25 MED ORDER — MAGIC MOUTHWASH: 15.0000 mL | Freq: Four times a day (QID) | ORAL | Status: DC | PRN

## 2023-06-25 MED ORDER — METRONIDAZOLE 500 MG/100ML IV SOLN
500.0000 mg | Freq: Two times a day (BID) | INTRAVENOUS | Status: DC
  Filled 2023-06-25: qty 100

## 2023-06-25 MED ORDER — ONDANSETRON 4 MG PO TBDP: 4.0000 mg | ORAL_TABLET | Freq: Four times a day (QID) | ORAL | Status: DC | PRN

## 2023-06-25 MED ORDER — SALINE SPRAY 0.65 % NA SOLN: 1.0000 | Freq: Four times a day (QID) | NASAL | Status: DC | PRN

## 2023-06-25 MED ORDER — DIPHENHYDRAMINE HCL 12.5 MG/5ML PO ELIX
12.5000 mg | ORAL_SOLUTION | Freq: Four times a day (QID) | ORAL | Status: DC | PRN
  Administered 2023-06-27: 12.5 mg via ORAL
  Filled 2023-06-25: qty 5

## 2023-06-25 MED ORDER — MOMETASONE FUROATE 100 MCG/ACT IN AERO: 2.0000 | INHALATION_SPRAY | Freq: Two times a day (BID) | RESPIRATORY_TRACT | Status: DC

## 2023-06-25 MED ORDER — METHOCARBAMOL 1000 MG/10ML IJ SOLN: 1000.0000 mg | Freq: Four times a day (QID) | INTRAMUSCULAR | Status: DC | PRN

## 2023-06-25 MED ORDER — AMPHETAMINE-DEXTROAMPHETAMINE 10 MG PO TABS: 20.0000 mg | ORAL_TABLET | Freq: Two times a day (BID) | ORAL | Status: DC

## 2023-06-25 MED ORDER — CHLORHEXIDINE GLUCONATE CLOTH 2 % EX PADS
6.0000 | MEDICATED_PAD | Freq: Once | CUTANEOUS | Status: AC
  Administered 2023-06-26: 6 via TOPICAL

## 2023-06-25 MED ORDER — CLONAZEPAM 1 MG PO TABS
1.0000 mg | ORAL_TABLET | Freq: Two times a day (BID) | ORAL | Status: DC
  Administered 2023-06-25 – 2023-06-26 (×2): 1 mg via ORAL
  Filled 2023-06-25: qty 1
  Filled 2023-06-25: qty 2
  Filled 2023-06-25: qty 1

## 2023-06-25 MED ORDER — PRAZOSIN HCL 1 MG PO CAPS
2.0000 mg | ORAL_CAPSULE | Freq: Every day | ORAL | Status: DC
  Administered 2023-06-25 – 2023-06-26 (×2): 2 mg via ORAL
  Filled 2023-06-25 (×2): qty 2

## 2023-06-25 MED ORDER — METOPROLOL TARTRATE 5 MG/5ML IV SOLN: 5.0000 mg | Freq: Four times a day (QID) | INTRAVENOUS | Status: DC | PRN

## 2023-06-25 MED ORDER — ONDANSETRON HCL 4 MG/2ML IJ SOLN
4.0000 mg | Freq: Once | INTRAMUSCULAR | Status: AC
  Administered 2023-06-25: 4 mg via INTRAVENOUS
  Filled 2023-06-25: qty 2

## 2023-06-25 MED ORDER — SIMETHICONE 80 MG PO CHEW: 40.0000 mg | CHEWABLE_TABLET | Freq: Four times a day (QID) | ORAL | Status: DC | PRN

## 2023-06-25 MED ORDER — HYDROMORPHONE HCL 1 MG/ML IJ SOLN: 0.5000 mg | INTRAMUSCULAR | Status: DC | PRN

## 2023-06-25 MED ORDER — METRONIDAZOLE 500 MG/100ML IV SOLN
500.0000 mg | Freq: Once | INTRAVENOUS | Status: DC
  Administered 2023-06-25: 500 mg via INTRAVENOUS
  Filled 2023-06-25: qty 100

## 2023-06-25 MED ORDER — LACTATED RINGERS IV SOLN: INTRAVENOUS | Status: DC

## 2023-06-25 MED ORDER — CHLORHEXIDINE GLUCONATE CLOTH 2 % EX PADS
6.0000 | MEDICATED_PAD | Freq: Once | CUTANEOUS | Status: AC
  Administered 2023-06-25: 6 via TOPICAL

## 2023-06-25 MED ORDER — DIPHENHYDRAMINE HCL 50 MG/ML IJ SOLN
12.5000 mg | Freq: Four times a day (QID) | INTRAMUSCULAR | Status: DC | PRN
  Administered 2023-06-26 (×2): 12.5 mg via INTRAVENOUS
  Filled 2023-06-25 (×2): qty 1

## 2023-06-25 MED ORDER — LORATADINE 10 MG PO TABS
10.0000 mg | ORAL_TABLET | Freq: Every evening | ORAL | Status: DC
  Administered 2023-06-25 – 2023-06-26 (×2): 10 mg via ORAL
  Filled 2023-06-25 (×2): qty 1

## 2023-06-25 MED ORDER — BUPROPION HCL ER (XL) 150 MG PO TB24
150.0000 mg | ORAL_TABLET | Freq: Every day | ORAL | Status: DC
  Administered 2023-06-26: 150 mg via ORAL
  Filled 2023-06-25 (×2): qty 1

## 2023-06-25 MED ORDER — SODIUM CHLORIDE 0.9 % IV SOLN: Freq: Three times a day (TID) | INTRAVENOUS | Status: DC | PRN

## 2023-06-25 MED ORDER — NAPHAZOLINE-GLYCERIN 0.012-0.25 % OP SOLN: 1.0000 [drp] | Freq: Four times a day (QID) | OPHTHALMIC | Status: DC | PRN

## 2023-06-25 MED ORDER — PHENOL 1.4 % MT LIQD: 2.0000 | OROMUCOSAL | Status: DC | PRN

## 2023-06-25 MED ORDER — ONDANSETRON HCL 4 MG/2ML IJ SOLN
4.0000 mg | Freq: Four times a day (QID) | INTRAMUSCULAR | Status: DC | PRN
Start: 2023-06-25 — End: 2023-06-27

## 2023-06-25 MED ORDER — HYDROCODONE-ACETAMINOPHEN 5-325 MG PO TABS: 1.0000 | ORAL_TABLET | ORAL | Status: DC | PRN

## 2023-06-25 MED ORDER — SODIUM CHLORIDE 0.9 % IV SOLN: 2.0000 g | INTRAVENOUS | Status: DC

## 2023-06-25 MED ORDER — MENTHOL 3 MG MT LOZG: 1.0000 | LOZENGE | OROMUCOSAL | Status: DC | PRN

## 2023-06-25 MED ORDER — SODIUM CHLORIDE 0.9 % IV BOLUS
1000.0000 mL | Freq: Once | INTRAVENOUS | Status: AC
  Administered 2023-06-25: 1000 mL via INTRAVENOUS

## 2023-06-25 MED ORDER — METHOCARBAMOL 500 MG PO TABS: 1000.0000 mg | ORAL_TABLET | Freq: Four times a day (QID) | ORAL | Status: DC | PRN

## 2023-06-25 MED ORDER — ACETAMINOPHEN 650 MG RE SUPP: 650.0000 mg | Freq: Four times a day (QID) | RECTAL | Status: DC | PRN

## 2023-06-25 MED ORDER — IOHEXOL 300 MG/ML  SOLN
100.0000 mL | Freq: Once | INTRAMUSCULAR | Status: AC | PRN
  Administered 2023-06-25: 100 mL via INTRAVENOUS

## 2023-06-25 MED ORDER — HYDROMORPHONE HCL 1 MG/ML IJ SOLN
0.5000 mg | Freq: Once | INTRAMUSCULAR | Status: AC
  Administered 2023-06-25: 0.5 mg via INTRAVENOUS
  Filled 2023-06-25: qty 1

## 2023-06-25 MED ORDER — FENTANYL CITRATE PF 50 MCG/ML IJ SOSY
50.0000 ug | PREFILLED_SYRINGE | Freq: Once | INTRAMUSCULAR | Status: AC
  Administered 2023-06-25: 50 ug via INTRAVENOUS
  Filled 2023-06-25: qty 1

## 2023-06-25 NOTE — Anesthesia Preprocedure Evaluation (Addendum)
 Anesthesia Evaluation  Patient identified by MRN, date of birth, ID band Patient awake    Reviewed: Allergy & Precautions, NPO status , Patient's Chart, lab work & pertinent test results  History of Anesthesia Complications Negative for: history of anesthetic complications  Airway Mallampati: II  TM Distance: >3 FB Neck ROM: Full    Dental  (+) Missing,    Pulmonary asthma    Pulmonary exam normal        Cardiovascular negative cardio ROS Normal cardiovascular exam     Neuro/Psych   Anxiety        GI/Hepatic Neg liver ROS,GERD  Medicated,,Acute appendicitis   Endo/Other  negative endocrine ROS    Renal/GU negative Renal ROS     Musculoskeletal  (+) Arthritis ,    Abdominal   Peds  Hematology negative hematology ROS (+)   Anesthesia Other Findings Day of surgery medications reviewed with patient.  Reproductive/Obstetrics                              Anesthesia Physical Anesthesia Plan  ASA: 2  Anesthesia Plan: General   Post-op Pain Management: Tylenol  PO (pre-op)*   Induction: Intravenous  PONV Risk Score and Plan: 3 and Treatment may vary due to age or medical condition, Ondansetron , Dexamethasone, Midazolam, Scopolamine patch - Pre-op, Propofol infusion and TIVA  Airway Management Planned: Oral ETT  Additional Equipment: None  Intra-op Plan:   Post-operative Plan: Extubation in OR  Informed Consent: I have reviewed the patients History and Physical, chart, labs and discussed the procedure including the risks, benefits and alternatives for the proposed anesthesia with the patient or authorized representative who has indicated his/her understanding and acceptance.     Dental advisory given  Plan Discussed with: CRNA  Anesthesia Plan Comments:         Anesthesia Quick Evaluation

## 2023-06-25 NOTE — ED Provider Notes (Signed)
 [place] EMERGENCY DEPARTMENT AT [facility] Provider Note   CSN: [id number] Arrival date & time: 06/25/23  1237     History  Chief Complaint  Patient presents with   Abdominal Pain    Valerie [name] is a 48 y.o. female.  48 year old female with complaint of right side abdominal pain onset 9pm last night associated with nausea. Pain is worse with palpation. No associated vomiting, fevers, changes in bowel or bladder habits. Hx of hysterectomy.        Home Medications Prior to Admission medications   Medication Sig Start Date End Date Taking? Authorizing Provider  albuterol  (PROVENTIL ) (2.5 MG/3ML) 0.083% nebulizer solution Take 3 mLs (2.5 mg total) by nebulization every 6 (six) hours as needed for wheezing or shortness of breath. 01/27/23  Yes Patel, Ammie Bale, MD  albuterol  (VENTOLIN  HFA) 108 (90 Base) MCG/ACT inhaler INHALE 1 TO 2 PUFFS BY MOUTH EVERY 6 HOURS AS NEEDED FOR WHEEZING FOR SHORTNESS OF BREATH 06/15/23  Yes Kandice Orleans, MD  amphetamine -dextroamphetamine  (ADDERALL) 20 MG tablet Take 20 mg by mouth 2 (two) times daily. 01/12/23  Yes [provider]  buPROPion  (WELLBUTRIN  XL) 150 MG 24 hr tablet Take 150 mg by mouth daily.   Yes [provider]  clonazePAM  (KLONOPIN ) 1 MG tablet Take 1 mg by mouth 2 (two) times daily.   Yes [provider]  Crisaborole  (EUCRISA ) 2 % OINT Apply 1 application topically 2 (two) times daily. 03/07/16  Yes Rochester Chuck, MD  diphenhydrAMINE  (BENADRYL ) 25 MG tablet Take 50 mg by mouth daily as needed for itching (when she takes opioids).   Yes [provider]  EPINEPHrine  (AUVI-Q ) 0.3 mg/0.3 mL IJ SOAJ injection Use as directed for severe allergic reaction 02/01/19  Yes Bardelas, Almon Jaegers, MD  levalbuterol  (XOPENEX ) 0.63 MG/3ML nebulizer solution Take 3 mLs (0.63 mg total) by nebulization every 6 (six) hours as needed for wheezing or shortness of breath. 04/22/23  Yes Kandice Orleans, MD   levocetirizine (XYZAL ) 5 MG tablet Take 5 mg by mouth every evening.   Yes [provider]  Mometasone  Furoate (ASMANEX  HFA) 100 MCG/ACT AERO Inhale 2 puffs into the lungs in the morning and at bedtime. 01/27/23  Yes Kandice Orleans, MD  pantoprazole  (PROTONIX ) 40 MG tablet Take 1 tablet by mouth  daily Need appointment for  refills 05/28/15  Yes [provider]  prazosin  (MINIPRESS ) 2 MG capsule Take 2 mg by mouth at bedtime.   Yes [provider]  promethazine (PHENERGAN) 25 MG tablet Take 25 mg by mouth daily as needed for nausea. 10/26/15  Yes [provider]  SM MULTIPLE VITAMINS/IRON TABS Take by mouth.   Yes [provider]  triamcinolone  ointment (KENALOG ) 0.1 % APPLY OINTMENT TOPICALLY TWICE DAILY TO RED ITCHY AREA BELOW FACE AND NECK 05/30/19  Yes Bardelas, Almon Jaegers, MD      Allergies    Gabapentin, Other, Lactose intolerance (gi), Lidocaine, Molds & smuts, and Oxycodone    Review of Systems   Review of Systems Negative except as per HPI Physical Exam Updated Vital Signs BP 118/88 (BP Location: Right Arm)   Pulse 91   Temp (!) 97.5 F (36.4 C) (Oral)   Resp 15   Ht 5\' 2"  (1.575 m)   Wt 78.9 kg   LMP 10/24/2014 Comment: still going on since Mirnea inserted   SpO2 98%   BMI 31.83 kg/m  Physical Exam Vitals and nursing note  reviewed.  Constitutional:      General: She is not in acute distress.    Appearance: She is well-developed. She is not diaphoretic.  HENT:     Head: Normocephalic and atraumatic.  Cardiovascular:     Rate and Rhythm: Normal rate and regular rhythm.     Heart sounds: Normal heart sounds.  Pulmonary:     Effort: Pulmonary effort is normal.     Breath sounds: Normal breath sounds.  Abdominal:     Palpations: Abdomen is soft.     Tenderness: There is abdominal tenderness in the right upper quadrant and right lower quadrant. There is no right CVA tenderness or left CVA tenderness.  Skin:    General: Skin is  warm and dry.  Neurological:     Mental Status: She is alert and oriented to person, place, and time.  Psychiatric:        Behavior: Behavior normal.     ED Results / Procedures / Treatments   Labs (all labs ordered are listed, but only abnormal results are displayed) Labs Reviewed  CBC WITH DIFFERENTIAL/PLATELET - Abnormal; Notable for the following components:      Result Value   Platelets 475 (*)    All other components within normal limits  COMPREHENSIVE METABOLIC PANEL WITH GFR - Abnormal; Notable for the following components:   Sodium 134 (*)    Potassium 3.3 (*)    All other components within normal limits  URINALYSIS, ROUTINE W REFLEX MICROSCOPIC - Abnormal; Notable for the following components:   Color, Urine STRAW (*)    Specific Gravity, Urine 1.003 (*)    All other components within normal limits  LIPASE, BLOOD  HCG, QUANTITATIVE, PREGNANCY  HIV ANTIBODY (ROUTINE TESTING W REFLEX)    EKG None  Radiology CT ABDOMEN PELVIS W CONTRAST Result Date: 06/25/2023 CLINICAL DATA:  Abdominal pain, acute, nonlocalized EXAM: CT ABDOMEN AND PELVIS WITH CONTRAST TECHNIQUE: Multidetector CT imaging of the abdomen and pelvis was performed using the standard protocol following bolus administration of intravenous contrast. RADIATION DOSE REDUCTION: This exam was performed according to the departmental dose-optimization program which includes automated exposure control, adjustment of the mA and/or kV according to patient size and/or use of iterative reconstruction technique. CONTRAST:  OMNIPAQUE  IOHEXOL  300 MG/ML  SOLN COMPARISON:  Right upper quadrant ultrasound from earlier the same day. FINDINGS: Lower chest: There are dependent changes in the visualized lung bases. No overt consolidation. No pleural effusion. The heart is normal in size. No pericardial effusion. Hepatobiliary: The liver is normal in size. Non-cirrhotic configuration. No suspicious mass. There is a 5 mm  hypoattenuating focus in the right hepatic lobe, segment 6, which is too small to adequately characterize. No intrahepatic or extrahepatic bile duct dilation. No calcified gallstones. Normal gallbladder wall thickness. No pericholecystic inflammatory changes. Pancreas: Unremarkable. No pancreatic ductal dilatation or surrounding inflammatory changes. Spleen: Within normal limits. No focal lesion. Adrenals/Urinary Tract: Adrenal glands are unremarkable. No suspicious renal mass. There is excreted contrast in bilateral renal collecting systems, which limits the evaluation for nonobstructing calculi. No obstructing calculi or hydroureteronephrosis noted. Unremarkable urinary bladder. Stomach/Bowel: No disproportionate dilation of the small or large bowel loops. The base of the appendix is nondilated and appears within normal limits. However, the tip is dilated measuring up to 8 mm and exhibit moderate periappendiceal fat stranding. There is a 5 mm appendicoliths in the tip. In appropriate clinical settings, findings favor tip appendicitis. No associated walled-off abscess or loculated collection. There is  small amount of free fluid in the right paracolic gutter and dependent pelvis, which is favored benign/reactive. No pneumoperitoneum. Vascular/Lymphatic: No abdominal or pelvic lymphadenopathy, by size criteria. No aneurysmal dilation of the major abdominal arteries. Reproductive: The uterus is surgically absent. No large adnexal mass. Other: There is a tiny fat containing umbilical hernia. The soft tissues and abdominal wall are otherwise unremarkable. Musculoskeletal: No suspicious osseous lesions. IMPRESSION: 1. Findings favor tip appendicitis, as described above. No associated walled-off abscess or loculated collection. 2. Multiple other nonacute observations, as described above. Electronically Signed   By: Beula Brunswick M.D.   On: 06/25/2023 18:29   US  Abdomen Limited Result Date: 06/25/2023 CLINICAL DATA:   Right upper quadrant pain EXAM: ULTRASOUND ABDOMEN LIMITED RIGHT UPPER QUADRANT COMPARISON:  10/19/2015 ultrasound FINDINGS: Gallbladder: No gallstones or wall thickening visualized. No sonographic Gwynn Chalker sign noted by sonographer. Common bile duct: Diameter: 3 mm Liver: No focal lesion identified. Within normal limits in parenchymal echogenicity. Portal vein is patent on color Doppler imaging with normal direction of blood flow towards the liver. Other: None. IMPRESSION: No gallstones or ductal dilatation. Electronically Signed   By: Adrianna Horde M.D.   On: 06/25/2023 13:47    Procedures .Critical Care  Performed by: Darlis Eisenmenger, PA-C Authorized by: Darlis Eisenmenger, PA-C   Critical care provider statement:    Critical care time (minutes):  30   Critical care was time spent personally by me on the following activities:  Development of treatment plan with patient or surrogate, discussions with consultants, evaluation of patient's response to treatment, examination of patient, ordering and review of laboratory studies, ordering and review of radiographic studies, ordering and performing treatments and interventions, pulse oximetry, re-evaluation of patient's condition and review of old charts   Care discussed with: admitting provider   Comments:     Pain management      Medications Ordered in ED Medications  0.9 %  sodium chloride  infusion (has no administration in time range)  lactated ringers  infusion (has no administration in time range)  acetaminophen  (TYLENOL ) tablet 650 mg (has no administration in time range)    Or  acetaminophen  (TYLENOL ) suppository 650 mg (has no administration in time range)  HYDROmorphone  (DILAUDID ) injection 0.5-2 mg (has no administration in time range)  diphenhydrAMINE  (BENADRYL ) 12.5 MG/5ML elixir 12.5 mg (has no administration in time range)    Or  diphenhydrAMINE  (BENADRYL ) injection 12.5 mg (has no administration in time range)  bisacodyl  (DULCOLAX)  suppository 10 mg (has no administration in time range)  ondansetron  (ZOFRAN -ODT) disintegrating tablet 4 mg (has no administration in time range)    Or  ondansetron  (ZOFRAN ) injection 4 mg (has no administration in time range)  prochlorperazine  (COMPAZINE ) tablet 10 mg (has no administration in time range)    Or  prochlorperazine  (COMPAZINE ) injection 5-10 mg (has no administration in time range)  simethicone  (MYLICON) chewable tablet 40 mg (has no administration in time range)  metoprolol  tartrate (LOPRESSOR ) injection 5 mg (has no administration in time range)  enoxaparin  (LOVENOX ) injection 40 mg (has no administration in time range)  cefTRIAXone  (ROCEPHIN ) 2 g in sodium chloride  0.9 % 100 mL IVPB (has no administration in time range)    And  metroNIDAZOLE  (FLAGYL ) IVPB 500 mg (has no administration in time range)  methocarbamol  (ROBAXIN ) tablet 1,000 mg (has no administration in time range)  methocarbamol  (ROBAXIN ) injection 1,000 mg (has no administration in time range)  HYDROcodone -acetaminophen  (NORCO/VICODIN) 5-325 MG per tablet 1-2 tablet (has no  administration in time range)  phenol (CHLORASEPTIC) mouth spray 2 spray (has no administration in time range)  menthol -cetylpyridinium (CEPACOL) lozenge 3 mg (has no administration in time range)  magic mouthwash (has no administration in time range)  naphazoline-glycerin  (CLEAR EYES REDNESS) ophth solution 1-2 drop (has no administration in time range)  sodium chloride  (OCEAN) 0.65 % nasal spray 1-2 spray (has no administration in time range)  albuterol  (PROVENTIL ) (2.5 MG/3ML) 0.083% nebulizer solution 2.5 mg (has no administration in time range)  amphetamine -dextroamphetamine  (ADDERALL) tablet 20 mg (has no administration in time range)  clonazePAM  (KLONOPIN ) tablet 1 mg (has no administration in time range)  buPROPion  (WELLBUTRIN  XL) 24 hr tablet 150 mg (has no administration in time range)  Crisaborole  2 % OINT 1 application  (has no  administration in time range)  EPINEPHrine  (EPI-PEN) injection 0.3 mg (has no administration in time range)  levocetirizine (XYZAL ) tablet 5 mg (has no administration in time range)  Mometasone  Furoate AERO 2 puff (has no administration in time range)  pantoprazole  (PROTONIX ) EC tablet 40 mg (has no administration in time range)  prazosin  (MINIPRESS ) capsule 2 mg (has no administration in time range)  sodium chloride  0.9 % bolus 1,000 mL (1,000 mLs Intravenous New Bag/Given 06/25/23 1412)  ondansetron  (ZOFRAN ) injection 4 mg (4 mg Intravenous Given 06/25/23 1412)  fentaNYL  (SUBLIMAZE ) injection 50 mcg (50 mcg Intravenous Given 06/25/23 1411)  HYDROmorphone  (DILAUDID ) injection 0.5 mg (0.5 mg Intravenous Given 06/25/23 1524)  iohexol  (OMNIPAQUE ) 300 MG/ML solution 100 mL (100 mLs Intravenous Contrast Given 06/25/23 1743)  HYDROmorphone  (DILAUDID ) injection 1 mg (1 mg Intravenous Given 06/25/23 1900)    ED Course/ Medical Decision Making/ A&P                                 Medical Decision Making Amount and/or Complexity of Data Reviewed Labs: ordered. Radiology: ordered.  Risk Prescription drug management. Decision regarding hospitalization.   This patient presents to the ED for concern of right side abdominal pain, this involves an extensive number of treatment options, and is a complaint that carries with it a high risk of complications and morbidity.  The differential diagnosis includes acute cholecystitis, appendicitis, colitis, GERD, PUD.    Co morbidities that complicate the patient evaluation  Asthma, prior abdominal hysterectomy    Additional history obtained:  Additional history obtained from significant other at bedside who contributes to history as above External records from outside source obtained and reviewed including surgical history. No recent labs on file for comparison    Lab Tests:  I Ordered, and personally interpreted labs.  The pertinent results include: hCG  negative.  Lipase and lites.  CBC within normals.  CMP with mild hypokalemia potassium of 3.3.  Urinalysis is unremarkable.   Imaging Studies ordered:  I ordered imaging studies including US  gallbladder, CT abdomen/pelvis   I independently visualized and interpreted imaging which showed appendicitis I agree with the radiologist interpretation   Problem List / ED Course / Critical interventions / Medication management  48 year old female with complaint of right side abdominal pain onset 9 PM after eating dinner.  Is found to have right side abdominal tenderness.  Ultrasound of the gallbladder obtained in triage is negative for acute coronary cystitis.  Labs are reassuring.  CT obtained for ongoing pain which is concerning for tip appendicitis.  Patient provided with several doses of Dilaudid , fentanyl  for pain control.  Pain improved.  Also  given Rocephin  and Flagyl  for her appendicitis.  Discussed with surgery who will admit. I ordered medication including Zofran , fentanyl , dilaudid   for pain, nausea  Reevaluation of the patient after these medicines showed that the patient improved I have reviewed the patients home medicines and have made adjustments as needed   Consultations Obtained:  I requested consultation with the Dr. Hershell Lose, general surgery,  and discussed lab and imaging findings as well as pertinent plan - they recommend: will see the patient    Social Determinants of Health:  Lives with family   Test / Admission - Considered:  Admit to surgery         Final Clinical Impression(s) / ED Diagnoses Final diagnoses:  Acute appendicitis with localized peritonitis, without perforation, abscess, or gangrene    Rx / DC Orders ED Discharge Orders     None         Darlis Eisenmenger, PA-C 06/25/23 2024    Teddi Favors, DO 06/26/23 [id number]

## 2023-06-25 NOTE — ED Triage Notes (Signed)
 Pt to er, pt states that she had a late dinner last night and she started having some abd pain, states that she thought that it was gas pains, states that she took some pepto without relief.  States that she took some pain med and this helped a little, but when she woke up she still had the abd pain and L sided abd pain.  States that she went to urgent care and they sent her here for some imaging.

## 2023-06-25 NOTE — H&P (Signed)
 Valerie [name]  [birth year] [id number]  CARE TEAM:  PCP: Erminia Hazel, PA-C  Outpatient Care Team: Patient Care Team: Fulbright, [place]  E, PA-C as PCP - General (Family Medicine) Ali Ink, PhD as Referring Physician (Behavioral Health) Beaulah Bouquet (Gastroenterology)  Inpatient Treatment Team: Treatment Team:  Teddi Favors, DO Lidia Reels, RN Engstrom, Julianna G, NT [facility], Md, MD Wolm Hawthorne D, NT   This patient is a 48 y.o.female who presents today for surgical evaluation at the request of PA Editha Goring, [place] [facility]   Chief complaint / Reason for evaluation: Acute appendicitis  48 year old female.  History of some dermatitis asthma and lactose intolerance and food allergies.  GERD.  Intermittent diarrhea.  Followed by gastroenterology in [facility].  Some history of ADHD depression and anxiety followed by behavioral health.  Relatively stable.  Distant history of some endometriosis.  History of C-section hysterectomy.  No other abdominal surgery.  No sick contacts or travel history.  No severe vomiting but decreased appetite.  No UTIs.  Noticed abdominal pain after dinner yesterday and last night.  Tried some Pepto-Bismol's along with Motrin and Tylenol without relief.  Eventually went to ER.  History physical and CAT scans concerning for appendicitis.  Surgical consultation requested.   Assessment  Valerie [name]  48 y.o. female       Problem List:  Principal Problem:   Acute appendicitis Active Problems:   Gastro-esophageal reflux disease without esophagitis   Asthma, mild intermittent   Generalized anxiety disorder   Insomnia   Attention deficit hyperactivity disorder (ADHD)   History of major depression   History of physical CAT scan suspicious for appendicitis  Plan:  IV fluids  IV antibiotics.  Ceftriaxone since this does not seem to be particularly complex.  Nausea and pain  control.  Hospitalization.  Diagnostic laparoscopy with probable appendectomy in the morning by Dr. Margit Shelling on the [facility] surgical hospitalist service.  PPI for GERD.  Flonase  and albuterol  for asthma.  -monitor electrolytes & replace as needed  Keep K>4, Mg>2, Phos>3  -VTE prophylaxis- SCDs.  Anticoagulation prophyllaxis SQ as appropriate  -mobilize as tolerated to help recovery.  Enlist therapies in moderate/high risk patients as appropriate      I reviewed nursing notes, ED provider notes, last 24 h vitals and pain scores, last 48 h intake and output, last 24 h labs and trends, and last 24 h imaging results. I have reviewed this patient's available data, including medical history, events of note, test results, etc as part of my evaluation.  A significant portion of that time was spent in counseling.  Care during the described time interval was provided by me.  This care required straight-forward level of medical decision making.  06/25/2023  Eddye Goodie, MD, FACS, MASCRS Esophageal, Gastrointestinal & Colorectal Surgery Robotic and Minimally Invasive Surgery  [facility] [address], [address] [place], [place] [address] [phone] Fax [phone] Main  CONTACT INFORMATION: Weekday (9AM-5PM): Call [facility] main office at [phone] Weeknight (5PM-9AM) or Weekend/Holiday: Check EPIC "Web Links" tab & use "AMION" (password " [email or web address]") for General Surgery [facility] coverage  Please, DO NOT use SecureChat  (it is not reliable communication to reach operating surgeons & will lead to a delay in care).   Epic staff messaging available for outptient concerns needing 1-2 business day response.      06/25/2023  Past Medical History:  Diagnosis Date   Arthritis    Asthma    Generalized anxiety disorder 06/25/2023   H/O degenerative disc disease    Mild persistent asthma with acute exacerbation 08/24/2017    Past  Surgical History:  Procedure Laterality Date   ABDOMINAL HYSTERECTOMY     ROTATOR CUFF REPAIR Right 12/2016   WRIST SURGERY      Social History   Socioeconomic History   Marital status: Significant Other    Spouse name: Not on file   Number of children: Not on file   Years of education: Not on file   Highest education level: Not on file  Occupational History   Not on file  Tobacco Use   Smoking status: Never   Smokeless tobacco: Never  Vaping Use   Vaping status: Never Used  Substance and Sexual Activity   Alcohol use: Yes    Comment: social   Drug use: No   Sexual activity: Not on file  Other Topics Concern   Not on file  Social History Narrative   Not on file   Social Drivers of Health   Financial Resource Strain: Not on file  Food Insecurity: Low Risk  (08/14/2022)   Received from [facility]   Hunger Vital Sign    Worried About Running Out of Food in the Last Year: Never true    Ran Out of Food in the Last Year: Never true  Transportation Needs: Not on file (08/14/2022)  Physical Activity: Not on file  Stress: Not on file  Social Connections: Not on file  Intimate Partner Violence: Not on file    Family History  Problem Relation Age of Onset   Allergic rhinitis Mother    Asthma Maternal Aunt    Asthma Maternal Uncle    Allergic rhinitis Son    Asthma Daughter    Allergic rhinitis Daughter    Eczema Daughter    Immunodeficiency Neg Hx    Urticaria Neg Hx     Current Facility-Administered Medications  Medication Dose Route Frequency Provider Last Rate Last Admin   cefTRIAXone (ROCEPHIN) 2 g in sodium chloride  0.9 % 100 mL IVPB  2 g Intravenous Once Editha Goring A, PA-C 200 mL/hr at 06/25/23 1904 2 g at 06/25/23 1904   And   metroNIDAZOLE (FLAGYL) IVPB 500 mg  500 mg Intravenous Once Darlis Eisenmenger, PA-C 100 mL/hr at 06/25/23 1907 500 mg at 06/25/23 1907   Current Outpatient Medications  Medication Sig Dispense Refill   albuterol  (PROVENTIL ) (2.5  MG/3ML) 0.083% nebulizer solution Take 3 mLs (2.5 mg total) by nebulization every 6 (six) hours as needed for wheezing or shortness of breath. 75 mL 1   albuterol  (VENTOLIN  HFA) 108 (90 Base) MCG/ACT inhaler INHALE 1 TO 2 PUFFS BY MOUTH EVERY 6 HOURS AS NEEDED FOR WHEEZING FOR SHORTNESS OF BREATH 18 g 0   amphetamine-dextroamphetamine (ADDERALL) 20 MG tablet Take 20 mg by mouth 2 (two) times daily.     azithromycin  (ZITHROMAX  Z-PAK) 250 MG tablet Take 2 tablets on day 1, then 1 tablet daily from day 2-5. 6 tablet 0   buPROPion (WELLBUTRIN XL) 150 MG 24 hr tablet Take 150 mg by mouth daily.     clonazePAM (KLONOPIN) 1 MG tablet Take 1 mg by mouth 2 (two) times daily.     Crisaborole  (EUCRISA ) 2 % OINT Apply 1 application topically 2 (two) times daily. 60 g 3   diphenhydrAMINE (BENADRYL) 25 MG tablet Take 50 mg  by mouth.     EPINEPHrine  (AUVI-Q ) 0.3 mg/0.3 mL IJ SOAJ injection Use as directed for severe allergic reaction 2 each 1   fluconazole  (DIFLUCAN ) 150 MG tablet Take 1 tablet (150 mg total) by mouth daily. For yeast infection (Patient not taking: Reported on 01/27/2023) 1 tablet 0   fluticasone  (FLONASE ) 50 MCG/ACT nasal spray USE 2 SPRAY(S) IN EACH NOSTRIL ONCE DAILY FOR  STUFFY  NOSE (Patient not taking: Reported on 01/27/2023) 16 g 0   ibuprofen (ADVIL,MOTRIN) 800 MG tablet Take 1 tablet by mouth every 8 (eight) hours as needed. pain (Patient not taking: Reported on 01/27/2023)     levalbuterol  (XOPENEX ) 0.63 MG/3ML nebulizer solution Take 3 mLs (0.63 mg total) by nebulization every 6 (six) hours as needed for wheezing or shortness of breath. 75 mL 1   levocetirizine (XYZAL ) 5 MG tablet Take 5 mg by mouth. (Patient not taking: Reported on 01/27/2023)     levocetirizine (XYZAL ) 5 MG tablet Take 5 mg by mouth every evening.     Mometasone Furoate  (ASMANEX  HFA) 100 MCG/ACT AERO Inhale 2 puffs into the lungs in the morning and at bedtime. 13 g 5   Olopatadine HCl 0.7 % SOLN Apply to eye.      pantoprazole (PROTONIX) 40 MG tablet Take 1 tablet by mouth  daily Need appointment for  refills     prazosin (MINIPRESS) 2 MG capsule Take 2 mg by mouth at bedtime.     promethazine (PHENERGAN) 25 MG tablet Take 25 mg by mouth.     SM MULTIPLE VITAMINS/IRON TABS Take by mouth.     triamcinolone  ointment (KENALOG ) 0.1 % APPLY OINTMENT TOPICALLY TWICE DAILY TO RED ITCHY AREA BELOW FACE AND NECK 454 g 3     Allergies  Allergen Reactions   Gabapentin Nausea And Vomiting   Other Nausea And Vomiting    Was caused by some type of anesthesia   Lactose Intolerance (Gi)     Stomach pain and diarrhea    Lidocaine Nausea And Vomiting   Molds & Smuts Other (See Comments)    Flares asthma    Oxycodone Itching    OK with benadryl    ROS:   All other systems reviewed & are negative except per HPI or as noted below: Constitutional:  No fevers, chills, sweats.  Weight stable Eyes:  No vision changes, No discharge HENT:  No sore throats, nasal drainage Lymph: No neck swelling, No bruising easily Pulmonary:  No cough, productive sputum CV: No orthopnea, PND  Patient walks 20 minutes without difficulty.  No exertional chest/neck/shoulder/arm pain.  GI: Tolerance to certain foods especially lactose gives diarrhea.  Otherwise no personal nor family history of GI/colon cancer, inflammatory bowel disease, irritable bowel syndrome, allergy such as Celiac Sprue, dietary/dairy problems, colitis, ulcers nor gastritis.  No recent sick contacts/gastroenteritis.  No travel outside the country.  No changes in diet.  Renal: No UTIs, No hematuria Genital:  No drainage, bleeding, masses Musculoskeletal: No severe joint pain.  Good ROM major joints Skin:  No sores or lesions Heme/Lymph:  No easy bleeding.  No swollen lymph nodes   BP (!) 107/90   Pulse 90   Temp (!) 97.4 F (36.3 C) (Oral)   Resp (!) 22   Ht 5\' 2"  (1.575 m)   Wt 78.9 kg   LMP 10/24/2014 Comment: still going on since Mirnea inserted    SpO2 91%   BMI 31.83 kg/m     Results:   Labs: Results  for orders placed or performed during the hospital encounter of 06/25/23 (from the past 48 hours)  Urinalysis, Routine w reflex microscopic -Urine, Clean Catch     Status: Abnormal   Collection Time: 06/25/23  1:20 PM  Result Value Ref Range   Color, Urine STRAW (A) YELLOW   APPearance CLEAR CLEAR   Specific Gravity, Urine 1.003 (L) 1.005 - 1.030   pH 6.0 5.0 - 8.0   Glucose, UA NEGATIVE NEGATIVE mg/dL   Hgb urine dipstick NEGATIVE NEGATIVE   Bilirubin Urine NEGATIVE NEGATIVE   Ketones, ur NEGATIVE NEGATIVE mg/dL   Protein, ur NEGATIVE NEGATIVE mg/dL   Nitrite NEGATIVE NEGATIVE   Leukocytes,Ua NEGATIVE NEGATIVE    Comment: Performed at [facility], [address], [place], [place] [address]  CBC with Differential     Status: Abnormal   Collection Time: 06/25/23  2:25 PM  Result Value Ref Range   WBC 9.8 4.0 - 10.5 K/uL   RBC 4.62 3.87 - 5.11 MIL/uL   Hemoglobin 13.5 12.0 - 15.0 g/dL   HCT 37.1 69.6 - 78.9 %   MCV 87.9 80.0 - 100.0 fL   MCH 29.2 26.0 - 34.0 pg   MCHC 33.3 30.0 - 36.0 g/dL   RDW 38.1 01.7 - 51.0 %   Platelets 475 (H) 150 - 400 K/uL   nRBC 0.0 0.0 - 0.2 %   Neutrophils Relative % 67 %   Neutro Abs 6.6 1.7 - 7.7 K/uL   Lymphocytes Relative 26 %   Lymphs Abs 2.5 0.7 - 4.0 K/uL   Monocytes Relative 6 %   Monocytes Absolute 0.6 0.1 - 1.0 K/uL   Eosinophils Relative 1 %   Eosinophils Absolute 0.1 0.0 - 0.5 K/uL   Basophils Relative 0 %   Basophils Absolute 0.0 0.0 - 0.1 K/uL   Immature Granulocytes 0 %   Abs Immature Granulocytes 0.03 0.00 - 0.07 K/uL    Comment: Performed at [facility], [address], [place], [place] [address]  Comprehensive metabolic panel     Status: Abnormal   Collection Time: 06/25/23  2:25 PM  Result Value Ref Range   Sodium 134 (L) 135 - 145 mmol/L   Potassium 3.3 (L) 3.5 - 5.1 mmol/L   Chloride 100 98 - 111 mmol/L   CO2 24 22 - 32  mmol/L   Glucose, Bld 82 70 - 99 mg/dL    Comment: Glucose reference range applies only to samples taken after fasting for at least 8 hours.   BUN 7 6 - 20 mg/dL   Creatinine, Ser 7.78 0.44 - 1.00 mg/dL   Calcium 9.0 8.9 - 24.2 mg/dL   Total Protein 7.4 6.5 - 8.1 g/dL   Albumin 3.7 3.5 - 5.0 g/dL   AST 22 15 - 41 U/L   ALT 18 0 - 44 U/L   Alkaline Phosphatase 55 38 - 126 U/L   Total Bilirubin 1.0 0.0 - 1.2 mg/dL   GFR, Estimated >35 >36 mL/min    Comment: (NOTE) Calculated using the CKD-EPI Creatinine Equation (2021)    Anion gap 10 5 - 15    Comment: Performed at [facility], [address], [place], [place] [address]  Lipase, blood     Status: None   Collection Time: 06/25/23  2:25 PM  Result Value Ref Range   Lipase 34 11 - 51 U/L    Comment: Performed at [facility], [address] [place], [place]  [address]  hCG, quantitative, pregnancy     Status: None   Collection Time: 06/25/23  4:23 PM  Result Value Ref Range   hCG, Beta Chain, Quant, S <1 <5 mIU/mL    Comment:          GEST. AGE      CONC.  (mIU/mL)   <=1 WEEK        5 - 50     2 WEEKS       50 - 500     3 WEEKS       100 - 10,000     4 WEEKS     1,000 - 30,000     5 WEEKS     3,500 - 115,000   6-8 WEEKS     12,000 - 270,000    12 WEEKS     15,000 - 220,000        FEMALE AND NON-PREGNANT FEMALE:     LESS THAN 5 mIU/mL Performed at [facility], [address], [place], [place] [address]     Imaging / Studies: CT ABDOMEN PELVIS W CONTRAST Result Date: 06/25/2023 CLINICAL DATA:  Abdominal pain, acute, nonlocalized EXAM: CT ABDOMEN AND PELVIS WITH CONTRAST TECHNIQUE: Multidetector CT imaging of the abdomen and pelvis was performed using the standard protocol following bolus administration of intravenous contrast. RADIATION DOSE REDUCTION: This exam was performed according to the departmental dose-optimization program which includes automated exposure control,  adjustment of the mA and/or kV according to patient size and/or use of iterative reconstruction technique. CONTRAST:  OMNIPAQUE  IOHEXOL  300 MG/ML  SOLN COMPARISON:  Right upper quadrant ultrasound from earlier the same day. FINDINGS: Lower chest: There are dependent changes in the visualized lung bases. No overt consolidation. No pleural effusion. The heart is normal in size. No pericardial effusion. Hepatobiliary: The liver is normal in size. Non-cirrhotic configuration. No suspicious mass. There is a 5 mm hypoattenuating focus in the right hepatic lobe, segment 6, which is too small to adequately characterize. No intrahepatic or extrahepatic bile duct dilation. No calcified gallstones. Normal gallbladder wall thickness. No pericholecystic inflammatory changes. Pancreas: Unremarkable. No pancreatic ductal dilatation or surrounding inflammatory changes. Spleen: Within normal limits. No focal lesion. Adrenals/Urinary Tract: Adrenal glands are unremarkable. No suspicious renal mass. There is excreted contrast in bilateral renal collecting systems, which limits the evaluation for nonobstructing calculi. No obstructing calculi or hydroureteronephrosis noted. Unremarkable urinary bladder. Stomach/Bowel: No disproportionate dilation of the small or large bowel loops. The base of the appendix is nondilated and appears within normal limits. However, the tip is dilated measuring up to 8 mm and exhibit moderate periappendiceal fat stranding. There is a 5 mm appendicoliths in the tip. In appropriate clinical settings, findings favor tip appendicitis. No associated walled-off abscess or loculated collection. There is small amount of free fluid in the right paracolic gutter and dependent pelvis, which is favored benign/reactive. No pneumoperitoneum. Vascular/Lymphatic: No abdominal or pelvic lymphadenopathy, by size criteria. No aneurysmal dilation of the major abdominal arteries. Reproductive: The uterus is surgically  absent. No large adnexal mass. Other: There is a tiny fat containing umbilical hernia. The soft tissues and abdominal wall are otherwise unremarkable. Musculoskeletal: No suspicious osseous lesions. IMPRESSION: 1. Findings favor tip appendicitis, as described above. No associated walled-off abscess or loculated collection. 2. Multiple other nonacute observations, as described above. Electronically Signed   By: Beula Brunswick M.D.   On: 06/25/2023 18:29   US  Abdomen Limited Result Date: 06/25/2023 CLINICAL DATA:  Right upper quadrant pain EXAM: ULTRASOUND ABDOMEN LIMITED RIGHT UPPER QUADRANT COMPARISON:  10/19/2015 ultrasound FINDINGS: Gallbladder: No gallstones or wall thickening visualized. No sonographic Murphy sign noted by sonographer. Common bile duct: Diameter: 3 mm Liver: No focal lesion identified. Within normal limits in parenchymal echogenicity. Portal vein is patent on color Doppler imaging with normal direction of blood flow towards the liver. Other: None. IMPRESSION: No gallstones or ductal dilatation. Electronically Signed   By: Adrianna Horde M.D.   On: 06/25/2023 13:47    Medications / Allergies: per chart  Antibiotics: Anti-infectives (From admission, onward)    Start     Dose/Rate Route Frequency Ordered Stop   06/25/23 1900  cefTRIAXone (ROCEPHIN) 2 g in sodium chloride  0.9 % 100 mL IVPB       Placed in "And" Linked Group   2 g 200 mL/hr over 30 Minutes Intravenous  Once 06/25/23 1851     06/25/23 1900  metroNIDAZOLE (FLAGYL) IVPB 500 mg       Placed in "And" Linked Group   500 mg 100 mL/hr over 60 Minutes Intravenous  Once 06/25/23 1851           Note: Portions of this report may have been transcribed using voice recognition software. Every effort was made to ensure accuracy; however, inadvertent computerized transcription errors may be present.   Any transcriptional errors that result from this process are unintentional.    Eddye Goodie, MD, FACS,  MASCRS Esophageal, Gastrointestinal & Colorectal Surgery Robotic and Minimally Invasive Surgery  [facility] [address] [place], [place] [address] [phone] Fax [phone] Main  CONTACT INFORMATION: Weekday (9AM-5PM): Call [facility] main office at [phone] Weeknight (5PM-9AM) or Weekend/Holiday: Check EPIC "Web Links" tab & use "AMION" (password " [email or web address]") for General Surgery [facility] coverage  Please, DO NOT use SecureChat  (it is not reliable communication to reach operating surgeons & will lead to a delay in care).   Epic staff messaging available for outptient concerns needing 1-2 business day response.       06/25/2023  7:12 PM

## 2023-06-26 ENCOUNTER — Encounter (HOSPITAL_COMMUNITY): Payer: Self-pay

## 2023-06-26 ENCOUNTER — Observation Stay (HOSPITAL_BASED_OUTPATIENT_CLINIC_OR_DEPARTMENT_OTHER): Payer: Self-pay | Admitting: Anesthesiology

## 2023-06-26 ENCOUNTER — Encounter (HOSPITAL_COMMUNITY)
Admission: EM | Disposition: A | Discharge status: Home / Self Care | Payer: Self-pay | Source: Home / Self Care | Attending: Emergency Medicine

## 2023-06-26 ENCOUNTER — Observation Stay (HOSPITAL_COMMUNITY): Payer: Self-pay | Admitting: Anesthesiology

## 2023-06-26 ENCOUNTER — Other Ambulatory Visit: Payer: Self-pay

## 2023-06-26 DIAGNOSIS — K3580 Unspecified acute appendicitis: Secondary | ICD-10-CM | POA: Diagnosis not present

## 2023-06-26 DIAGNOSIS — K353 Acute appendicitis with localized peritonitis, without perforation or gangrene: Secondary | ICD-10-CM | POA: Diagnosis not present

## 2023-06-26 HISTORY — PX: LAPAROSCOPIC APPENDECTOMY: SHX408

## 2023-06-26 LAB — SURGICAL PCR SCREEN
MRSA, PCR: NEGATIVE
Staphylococcus aureus: NEGATIVE

## 2023-06-26 LAB — HIV ANTIBODY (ROUTINE TESTING W REFLEX): HIV Screen 4th Generation wRfx: NONREACTIVE

## 2023-06-26 SURGERY — APPENDECTOMY, LAPAROSCOPIC
Anesthesia: General

## 2023-06-26 MED ORDER — FENTANYL CITRATE (PF) 250 MCG/5ML IJ SOLN
INTRAMUSCULAR | Status: DC | PRN
  Administered 2023-06-26: 25 ug via INTRAVENOUS
  Administered 2023-06-26: 50 ug via INTRAVENOUS
  Administered 2023-06-26 (×2): 25 ug via INTRAVENOUS
  Administered 2023-06-26: 50 ug via INTRAVENOUS
  Administered 2023-06-26: 25 ug via INTRAVENOUS

## 2023-06-26 MED ORDER — PROPOFOL 10 MG/ML IV BOLUS
INTRAVENOUS | Status: DC | PRN
  Administered 2023-06-26: 150 mg via INTRAVENOUS
  Administered 2023-06-26: 50 mg via INTRAVENOUS

## 2023-06-26 MED ORDER — PROPOFOL 500 MG/50ML IV EMUL
INTRAVENOUS | Status: DC | PRN
  Administered 2023-06-26: 175 ug/kg/min via INTRAVENOUS

## 2023-06-26 MED ORDER — SCOPOLAMINE 1 MG/3DAYS TD PT72
1.0000 | MEDICATED_PATCH | Freq: Once | TRANSDERMAL | Status: DC
  Administered 2023-06-26: 1.5 mg via TRANSDERMAL
  Filled 2023-06-26: qty 1

## 2023-06-26 MED ORDER — METHOCARBAMOL 1000 MG/10ML IJ SOLN
1000.0000 mg | Freq: Three times a day (TID) | INTRAMUSCULAR | Status: DC
  Administered 2023-06-26 – 2023-06-27 (×3): 1000 mg via INTRAVENOUS
  Filled 2023-06-26 (×3): qty 10

## 2023-06-26 MED ORDER — LIDOCAINE 2% (20 MG/ML) 5 ML SYRINGE
INTRAMUSCULAR | Status: DC | PRN
  Administered 2023-06-26: 100 mg via INTRAVENOUS

## 2023-06-26 MED ORDER — DROPERIDOL 2.5 MG/ML IJ SOLN: 0.6250 mg | Freq: Once | INTRAMUSCULAR | Status: DC | PRN

## 2023-06-26 MED ORDER — 0.9 % SODIUM CHLORIDE (POUR BTL) OPTIME
TOPICAL | Status: DC | PRN
  Administered 2023-06-26: 1000 mL

## 2023-06-26 MED ORDER — OXYCODONE HCL 5 MG PO TABS
5.0000 mg | ORAL_TABLET | ORAL | Status: DC | PRN
  Administered 2023-06-26 – 2023-06-27 (×4): 10 mg via ORAL
  Filled 2023-06-26 (×4): qty 2

## 2023-06-26 MED ORDER — MIDAZOLAM HCL 2 MG/2ML IJ SOLN
INTRAMUSCULAR | Status: DC | PRN
  Administered 2023-06-26: 2 mg via INTRAVENOUS

## 2023-06-26 MED ORDER — MENTHOL 3 MG MT LOZG
1.0000 | LOZENGE | OROMUCOSAL | Status: DC | PRN
  Administered 2023-06-26: 3 mg via ORAL
  Filled 2023-06-26: qty 9

## 2023-06-26 MED ORDER — FENTANYL CITRATE (PF) 100 MCG/2ML IJ SOLN
INTRAMUSCULAR | Status: AC
Start: 2023-06-26 — End: ?
  Filled 2023-06-26: qty 2

## 2023-06-26 MED ORDER — AMPHETAMINE-DEXTROAMPHETAMINE 10 MG PO TABS
20.0000 mg | ORAL_TABLET | Freq: Every day | ORAL | Status: DC
  Administered 2023-06-27: 20 mg via ORAL
  Filled 2023-06-26: qty 2

## 2023-06-26 MED ORDER — CHLORHEXIDINE GLUCONATE 0.12 % MT SOLN
15.0000 mL | Freq: Once | OROMUCOSAL | Status: AC
  Administered 2023-06-26: 15 mL via OROMUCOSAL

## 2023-06-26 MED ORDER — PROPOFOL 1000 MG/100ML IV EMUL
INTRAVENOUS | Status: AC
Start: 2023-06-26 — End: ?
  Filled 2023-06-26: qty 100

## 2023-06-26 MED ORDER — LACTATED RINGERS IR SOLN
Status: DC | PRN
  Administered 2023-06-26: 1000 mL

## 2023-06-26 MED ORDER — SUGAMMADEX SODIUM 200 MG/2ML IV SOLN
INTRAVENOUS | Status: DC | PRN
  Administered 2023-06-26: 200 mg via INTRAVENOUS

## 2023-06-26 MED ORDER — DEXAMETHASONE SODIUM PHOSPHATE 10 MG/ML IJ SOLN
INTRAMUSCULAR | Status: DC | PRN
  Administered 2023-06-26: 10 mg via INTRAVENOUS

## 2023-06-26 MED ORDER — ONDANSETRON HCL 4 MG/2ML IJ SOLN
INTRAMUSCULAR | Status: DC | PRN
  Administered 2023-06-26: 4 mg via INTRAVENOUS

## 2023-06-26 MED ORDER — LIDOCAINE HCL 1 % IJ SOLN
INTRAMUSCULAR | Status: AC
  Filled 2023-06-26: qty 20

## 2023-06-26 MED ORDER — ACETAMINOPHEN 500 MG PO TABS
1000.0000 mg | ORAL_TABLET | ORAL | Status: AC
Start: 2023-06-26 — End: 2023-06-26
  Administered 2023-06-26: 1000 mg via ORAL
  Filled 2023-06-26: qty 2

## 2023-06-26 MED ORDER — LACTATED RINGERS IV SOLN: INTRAVENOUS | Status: DC

## 2023-06-26 MED ORDER — MIDAZOLAM HCL 2 MG/2ML IJ SOLN
INTRAMUSCULAR | Status: AC
Start: 2023-06-26 — End: ?
  Filled 2023-06-26: qty 2

## 2023-06-26 MED ORDER — BUPIVACAINE-EPINEPHRINE 0.25% -1:200000 IJ SOLN
INTRAMUSCULAR | Status: DC | PRN
  Administered 2023-06-26: 30 mL

## 2023-06-26 MED ORDER — FENTANYL CITRATE (PF) 100 MCG/2ML IJ SOLN
INTRAMUSCULAR | Status: AC
  Filled 2023-06-26: qty 2

## 2023-06-26 MED ORDER — ROCURONIUM BROMIDE 10 MG/ML (PF) SYRINGE
PREFILLED_SYRINGE | INTRAVENOUS | Status: DC | PRN
  Administered 2023-06-26: 60 mg via INTRAVENOUS

## 2023-06-26 MED ORDER — BUPIVACAINE-EPINEPHRINE (PF) 0.25% -1:200000 IJ SOLN
INTRAMUSCULAR | Status: AC
  Filled 2023-06-26: qty 30

## 2023-06-26 MED ORDER — SODIUM CHLORIDE 0.9 % IV SOLN
2.0000 g | INTRAVENOUS | Status: AC
  Administered 2023-06-26: 2 g via INTRAVENOUS
  Filled 2023-06-26 (×2): qty 2

## 2023-06-26 MED ORDER — HYDROMORPHONE HCL 1 MG/ML IJ SOLN: 0.2500 mg | INTRAMUSCULAR | Status: DC | PRN

## 2023-06-26 MED ORDER — ACETAMINOPHEN 500 MG PO TABS: 1000.0000 mg | ORAL_TABLET | Freq: Once | ORAL | Status: DC

## 2023-06-26 SURGICAL SUPPLY — 39 items
BAG COUNTER SPONGE SURGICOUNT (BAG) IMPLANT
CABLE HIGH FREQUENCY MONO STRZ (ELECTRODE) ×1 IMPLANT
CLIP APPLIE ROT 10 11.4 M/L (STAPLE) IMPLANT
COVER SURGICAL LIGHT HANDLE (MISCELLANEOUS) ×1 IMPLANT
CUTTER FLEX LINEAR 45M (STAPLE) IMPLANT
DERMABOND ADVANCED .7 DNX12 (GAUZE/BANDAGES/DRESSINGS) ×1 IMPLANT
ELECT REM PT RETURN 15FT ADLT (MISCELLANEOUS) ×1 IMPLANT
ENDOLOOP SUT PDS II 0 18 (SUTURE) IMPLANT
GLOVE INDICATOR 6.5 STRL GRN (GLOVE) ×1 IMPLANT
GLOVE SURG MICRO LTX SZ6 (GLOVE) ×1 IMPLANT
GOWN STRL REUS W/ TWL XL LVL3 (GOWN DISPOSABLE) ×1 IMPLANT
GRASPER SUT TROCAR 14GX15 (MISCELLANEOUS) IMPLANT
IRRIGATION SUCT STRKRFLW 2 WTP (MISCELLANEOUS) ×1 IMPLANT
KIT BASIN OR (CUSTOM PROCEDURE TRAY) ×1 IMPLANT
KIT TURNOVER KIT A (KITS) IMPLANT
LHOOK LAP DISP 36CM (ELECTROSURGICAL) IMPLANT
NDL INSUFFLATION 14GA 120MM (NEEDLE) ×1 IMPLANT
NEEDLE INSUFFLATION 14GA 120MM (NEEDLE) ×1 IMPLANT
PENCIL SMOKE EVACUATOR (MISCELLANEOUS) IMPLANT
POUCH LAPAROSCOPIC INSTRUMENT (MISCELLANEOUS) ×1 IMPLANT
RELOAD 45 VASCULAR/THIN (ENDOMECHANICALS) IMPLANT
RELOAD STAPLE 45 2.5 WHT GRN (ENDOMECHANICALS) IMPLANT
RELOAD STAPLE 45 3.5 BLU ETS (ENDOMECHANICALS) IMPLANT
RELOAD STAPLE TA45 3.5 REG BLU (ENDOMECHANICALS) ×1 IMPLANT
SCISSORS LAP 5X35 DISP (ENDOMECHANICALS) IMPLANT
SET TUBE SMOKE EVAC HIGH FLOW (TUBING) ×1 IMPLANT
SHEARS HARMONIC 36 ACE (MISCELLANEOUS) IMPLANT
SLEEVE Z-THREAD 5X100MM (TROCAR) ×1 IMPLANT
SPIKE FLUID TRANSFER (MISCELLANEOUS) ×1 IMPLANT
SUT MNCRL AB 4-0 PS2 18 (SUTURE) ×1 IMPLANT
SUT VIC AB 0 BRD 54 (SUTURE) IMPLANT
SYSTEM BAG RETRIEVAL 10MM (BASKET) ×1 IMPLANT
TOWEL OR 17X26 10 PK STRL BLUE (TOWEL DISPOSABLE) ×1 IMPLANT
TRAY FOLEY MTR SLVR 14FR STAT (SET/KITS/TRAYS/PACK) IMPLANT
TRAY FOLEY MTR SLVR 16FR STAT (SET/KITS/TRAYS/PACK) IMPLANT
TRAY LAPAROSCOPIC (CUSTOM PROCEDURE TRAY) ×1 IMPLANT
TROCAR ADV FIXATION 12X100MM (TROCAR) IMPLANT
TROCAR BALLN 12MMX100 BLUNT (TROCAR) ×1 IMPLANT
TROCAR Z-THREAD OPTICAL 5X100M (TROCAR) ×1 IMPLANT

## 2023-06-26 NOTE — Addendum Note (Signed)
 Addendum  created 06/26/23 0931 by Vernadine Golas, MD   Clinical Note Signed, Intraprocedure Blocks edited, SmartForm saved

## 2023-06-26 NOTE — Discharge Instructions (Signed)

## 2023-06-26 NOTE — Anesthesia Postprocedure Evaluation (Signed)
 Anesthesia Post Note  Patient: Valerie [name]  Procedure(s) Performed: APPENDECTOMY, LAPAROSCOPIC     Patient location during evaluation: PACU Anesthesia Type: General Level of consciousness: awake and alert Pain management: pain level controlled Vital Signs Assessment: post-procedure vital signs reviewed and stable Respiratory status: spontaneous breathing, nonlabored ventilation and respiratory function stable Cardiovascular status: blood pressure returned to baseline Postop Assessment: no apparent nausea or vomiting Anesthetic complications: no   No notable events documented.  Last Vitals:  Vitals:   06/26/23 0900 06/26/23 0915  BP: 118/76 113/76  Pulse: 99 96  Resp: 13 13  Temp:    SpO2: 100% 100%    Last Pain:  Vitals:   06/26/23 0915  TempSrc:   PainSc: 0-No pain                 Rayfield Cairo

## 2023-06-26 NOTE — Op Note (Signed)
 Date of Surgery: 06/26/2023 Admit Date: 06/25/2023 Performing Service: General Surgeons and Role:    Edmon Gosling, MD - Primary  Preoperative Diagnosis: Acute appendicitis  Postoperative Diagnosis: Acute appendicitis  Procedure(s) Performed:  - Foley catheter placement - Diagnostic laparoscopy - Laparoscopic appendectomy  Surgeon:    Freddrick Jaffe, MD  Anesthesia:    General endotracheal.  Specimens:  Appendix  Estimated Blood Loss:   Minimal.  Indications for Surgery:   This is a 48 y.o. female  who presented to us  with a 2-day history of abdominal pain, initially diffuse now localized to RLQ and right mid abdomen  The patient had a CT scan that showed  a thickened tip of appendix with appendicoliths.  Operative Findings: Inflamed tip of appendix with appendicoliths  Procedure:  After obtaining consent from the patient, the patient was taken to the operating room and laid supine on the operating table.  The patient was then placed under general endotracheal anesthesia.  A Foley catheter was placed in the usual standard fashion.  The anterior abdominal wall was prepped and draped in the usual standard fashion.    A small incision was made in the LUQ at Palmer's point and a veress needle was inserted. Air was aspirated and subsequent positive drop test. Abdomen then insufflated to . A periumbilical incision was then made and a 5mm trocar optiview using a 30 degree scope was inserted and the abdomen was entered under direct visualization. Inspection confirmed no evidence of trocar or veress site complications. The veress was then removed.   There was evidence of inflammation in the right lower quadrant.  A suprapubic 5 mm trocar was then placed in the usual standard fashion under direct vision.  A left lower quadrant 12 mm trocar was then placed under direct vision.  The patient was placed in a head down, left side down position.  The cecum was identified; this was lifted up.   The terminal ileum was identified.  The appendix came into full view.  The appendix was then grasped and retracted in a cephalad manner.  The mesoappendix was then clearly visualized.  A window was made in the mesentery at the base of the appendix.  A bowel load stapler was then fired across the base of the appendix. Using a harmonic scalpel, the mesoappendix was divided carefully. The appendix was then placed into an Endocatch bag. At this time, we then looked at the pelvis and reactive thin fluid was noted and suctioned. We then turned our attention back to the staple line and divided mesoappendix and both appeared intact and hemostatic. The appendix was then removed through the 12 mm port site with the trocar. The 12 mm trocar site fascia was closed using 0 Vicryl on a suture passer under direct visualization. The suprapubic trocar site was removed under direct vision.  The abdomen was desufflated and periumbilical port site removed.   Skin of all trocar sites were closed using 4-0 Monocryl in a subcuticular manner and Dermabond.  The Foley catheter was removed.  The patient was reversed from general endotracheal anesthesia, extubated and sent to PACU in stable condition.  Instrument, sponge, and needle counts were correct at closure and at the conclusion of the case.    Freddrick Jaffe, MD [facility]

## 2023-06-26 NOTE — Plan of Care (Signed)
  Problem: Health Behavior/Discharge Planning: Goal: Ability to manage health-related needs will improve Outcome: Progressing   Problem: Clinical Measurements: Goal: Ability to maintain clinical measurements within normal limits will improve Outcome: Progressing Goal: Will remain free from infection Outcome: Progressing   Problem: Activity: Goal: Risk for activity intolerance will decrease Outcome: Progressing   Problem: Pain Managment: Goal: General experience of comfort will improve and/or be controlled Outcome: Progressing

## 2023-06-26 NOTE — Progress Notes (Signed)
 Progress Note  * Day of Surgery *  Interval: Patient still having quite a bit of pain. No nausea or emesis since admission  Objective: Vital signs in last 24 hours: Temp:  [97.4 F (36.3 C)-98.2 F (36.8 C)] 97.9 F (36.6 C) (05/09 0517) Pulse Rate:  [88-102] 91 (05/09 0216) Resp:  [14-22] 16 (05/09 0517) BP: (102-128)/(42-90) 102/42 (05/09 0517) SpO2:  [91 %-100 %] 100 % (05/09 0517) Weight:  [78.9 kg] 78.9 kg (05/09 4401) Last BM Date : 06/25/23  Intake/Output from previous day: 05/08 0701 - 05/09 0700 In: 873.3 [P.O.:260; I.V.:513.3; IV Piggyback:100] Out: 0  Intake/Output this shift: Total I/O In: 873.3 [P.O.:260; I.V.:513.3; IV Piggyback:100] Out: 0   PE: General: female in mild distress secondary to pain HEENT: head is normocephalic, atraumatic Heart: regular, rate, and rhythm Lungs: Normal work of breathing on room air Abd: soft, tender to palpation in RLQ, well healed previous surgical incisions MS: all 4 extremities are symmetrical with no cyanosis, clubbing, or edema. Skin: warm and dry with no masses, lesions, or rashes Neuro: No focal neurologic deficits Psych: A&Ox3 with an appropriate affect.    Lab Results:  Recent Labs    06/25/23 1425  WBC 9.8  HGB 13.5  HCT 40.6  PLT 475*   BMET Recent Labs    06/25/23 1425  NA 134*  K 3.3*  CL 100  CO2 24  GLUCOSE 82  BUN 7  CREATININE 0.94  CALCIUM 9.0   PT/INR No results for input(s): "LABPROT", "INR" in the last 72 hours. CMP     Component Value Date/Time   NA 134 (L) 06/25/2023 1425   K 3.3 (L) 06/25/2023 1425   CL 100 06/25/2023 1425   CO2 24 06/25/2023 1425   GLUCOSE 82 06/25/2023 1425   BUN 7 06/25/2023 1425   CREATININE 0.94 06/25/2023 1425   CALCIUM 9.0 06/25/2023 1425   PROT 7.4 06/25/2023 1425   ALBUMIN 3.7 06/25/2023 1425   AST 22 06/25/2023 1425   ALT 18 06/25/2023 1425   ALKPHOS 55 06/25/2023 1425   BILITOT 1.0 06/25/2023 1425   GFRNONAA >60 06/25/2023 1425   GFRAA  >60 10/06/2014 2330   Lipase     Component Value Date/Time   LIPASE 34 06/25/2023 1425       Studies/Results: CT ABDOMEN PELVIS W CONTRAST Result Date: 06/25/2023 CLINICAL DATA:  Abdominal pain, acute, nonlocalized EXAM: CT ABDOMEN AND PELVIS WITH CONTRAST TECHNIQUE: Multidetector CT imaging of the abdomen and pelvis was performed using the standard protocol following bolus administration of intravenous contrast. RADIATION DOSE REDUCTION: This exam was performed according to the departmental dose-optimization program which includes automated exposure control, adjustment of the mA and/or kV according to patient size and/or use of iterative reconstruction technique. CONTRAST:  OMNIPAQUE  IOHEXOL  300 MG/ML  SOLN COMPARISON:  Right upper quadrant ultrasound from earlier the same day. FINDINGS: Lower chest: There are dependent changes in the visualized lung bases. No overt consolidation. No pleural effusion. The heart is normal in size. No pericardial effusion. Hepatobiliary: The liver is normal in size. Non-cirrhotic configuration. No suspicious mass. There is a 5 mm hypoattenuating focus in the right hepatic lobe, segment 6, which is too small to adequately characterize. No intrahepatic or extrahepatic bile duct dilation. No calcified gallstones. Normal gallbladder wall thickness. No pericholecystic inflammatory changes. Pancreas: Unremarkable. No pancreatic ductal dilatation or surrounding inflammatory changes. Spleen: Within normal limits. No focal lesion. Adrenals/Urinary Tract: Adrenal glands are unremarkable. No suspicious renal mass. There  is excreted contrast in bilateral renal collecting systems, which limits the evaluation for nonobstructing calculi. No obstructing calculi or hydroureteronephrosis noted. Unremarkable urinary bladder. Stomach/Bowel: No disproportionate dilation of the small or large bowel loops. The base of the appendix is nondilated and appears within normal limits. However,  the tip is dilated measuring up to 8 mm and exhibit moderate periappendiceal fat stranding. There is a 5 mm appendicoliths in the tip. In appropriate clinical settings, findings favor tip appendicitis. No associated walled-off abscess or loculated collection. There is small amount of free fluid in the right paracolic gutter and dependent pelvis, which is favored benign/reactive. No pneumoperitoneum. Vascular/Lymphatic: No abdominal or pelvic lymphadenopathy, by size criteria. No aneurysmal dilation of the major abdominal arteries. Reproductive: The uterus is surgically absent. No large adnexal mass. Other: There is a tiny fat containing umbilical hernia. The soft tissues and abdominal wall are otherwise unremarkable. Musculoskeletal: No suspicious osseous lesions. IMPRESSION: 1. Findings favor tip appendicitis, as described above. No associated walled-off abscess or loculated collection. 2. Multiple other nonacute observations, as described above. Electronically Signed   By: Beula Brunswick M.D.   On: 06/25/2023 18:29   US  Abdomen Limited Result Date: 06/25/2023 CLINICAL DATA:  Right upper quadrant pain EXAM: ULTRASOUND ABDOMEN LIMITED RIGHT UPPER QUADRANT COMPARISON:  10/19/2015 ultrasound FINDINGS: Gallbladder: No gallstones or wall thickening visualized. No sonographic Murphy sign noted by sonographer. Common bile duct: Diameter: 3 mm Liver: No focal lesion identified. Within normal limits in parenchymal echogenicity. Portal vein is patent on color Doppler imaging with normal direction of blood flow towards the liver. Other: None. IMPRESSION: No gallstones or ductal dilatation. Electronically Signed   By: Adrianna Horde M.D.   On: 06/25/2023 13:47     Assessment/Plan 48 yo F with tip appendicitis with appendicoliths  - Proceed to OR for laparoscopic appendectomy - The pathophysiology of appendicitis was discussed with the patient.  Natural history risks without surgery was discussed.   I feel the risks of  no intervention outweigh the operative risks; therefore, I recommended diagnostic laparoscopy with appendectomy.  Laparoscopic & open techniques were discussed. Risks including but not limited to: bleeding, infection, abscess, leak, reoperation, injury to other organs, need for repair of tissues / organs, possible ileocecectomy, possible conversion to open operation, possible need for drain, hernia, and post-op ileus were discussed. Goals of post-operative recovery were discussed as well.  Questions were answered.  The patient expresses understanding & wishes to proceed with surgery.   LOS: 0 days   I reviewed ED provider notes, last 24 h vitals and pain scores, last 48 h intake and output, last 24 h labs and trends, and last 24 h imaging results.  This care required moderate level of medical decision making.    Edmon Gosling, MD [facility] Surgery 06/26/2023, 6:43 AM Please see Amion for pager number during day hours 7:00am-4:30pm

## 2023-06-26 NOTE — Transfer of Care (Signed)
 Immediate Anesthesia Transfer of Care Note  Patient: Valerie [name]  Procedure(s) Performed: APPENDECTOMY, LAPAROSCOPIC  Patient Location: PACU  Anesthesia Type:General  Level of Consciousness: drowsy and patient cooperative  Airway & Oxygen Therapy: Patient Spontanous Breathing and Patient connected to face mask oxygen  Post-op Assessment: Report given to RN and Post -op Vital signs reviewed and stable  Post vital signs: Reviewed and stable  Last Vitals:  Vitals Value Taken Time  BP 114/72 06/26/23 0853  Temp    Pulse 100 06/26/23 0857  Resp 15 06/26/23 0857  SpO2 100 % 06/26/23 0857  Vitals shown include unfiled device data.  Last Pain:  Vitals:   06/26/23 0517  TempSrc: Oral  PainSc:          Complications: No notable events documented.

## 2023-06-26 NOTE — Anesthesia Procedure Notes (Addendum)
 Procedure Name: Intubation Date/Time: 06/26/2023 7:39 AM  Performed by: Stasia Edelman, CRNAPre-anesthesia Checklist: Patient identified, Emergency Drugs available, Suction available and Patient being monitored Patient Re-evaluated:Patient Re-evaluated prior to induction Oxygen Delivery Method: Circle System Utilized Preoxygenation: Pre-oxygenation with 100% oxygen Induction Type: IV induction Ventilation: Mask ventilation without difficulty Laryngoscope Size: Mac and 3 Grade View: Grade II Tube type: Oral Tube size: 7.0 mm Number of attempts: 2 Airway Equipment and Method: Stylet Placement Confirmation: ETT inserted through vocal cords under direct vision, positive ETCO2 and breath sounds checked- equal and bilateral Secured at: 21 cm Tube secured with: Tape Dental Injury: Teeth and Oropharynx as per pre-operative assessment  Comments: First intubation by CRNA,, esophageal, immediately recognized and ETT removed. Second attempt successful by CRNA with cricoid pressure. Atraumatic intubation. VSS. Jarrell Merritts, MD

## 2023-06-27 ENCOUNTER — Encounter (HOSPITAL_COMMUNITY): Payer: Self-pay | Admitting: General Surgery

## 2023-06-27 DIAGNOSIS — K353 Acute appendicitis with localized peritonitis, without perforation or gangrene: Secondary | ICD-10-CM | POA: Diagnosis not present

## 2023-06-27 MED ORDER — AMOXICILLIN-POT CLAVULANATE 875-125 MG PO TABS: 1.0000 | ORAL_TABLET | Freq: Two times a day (BID) | ORAL | 0 refills | Status: AC

## 2023-06-27 MED ORDER — HYDROCODONE-ACETAMINOPHEN 5-325 MG PO TABS
1.0000 | ORAL_TABLET | Freq: Four times a day (QID) | ORAL | 0 refills | Status: AC | PRN
Start: 2023-06-27 — End: ?

## 2023-06-27 NOTE — Progress Notes (Signed)
 1 Day Post-Op   Subjective/Chief Complaint: Complains of soreness but wants to go home   Objective: Vital signs in last 24 hours: Temp:  [97.4 F (36.3 C)-99.1 F (37.3 C)] 98.7 F (37.1 C) (05/10 0517) Pulse Rate:  [92-108] 92 (05/10 0517) Resp:  [15-16] 16 (05/10 0517) BP: (100-129)/(66-93) 100/66 (05/10 0517) SpO2:  [94 %-99 %] 94 % (05/10 0838) Last BM Date : 06/25/23  Intake/Output from previous day: 05/09 0701 - 05/10 0700 In: 710 [P.O.:610; IV Piggyback:100] Out: 1970 [Urine:1950; Blood:20] Intake/Output this shift: No intake/output data recorded.  General appearance: alert and cooperative Resp: clear to auscultation bilaterally Cardio: regular rate and rhythm GI: soft, mild tenderness. Good bs. Incisions ok  Lab Results:  Recent Labs    06/25/23 1425  WBC 9.8  HGB 13.5  HCT 40.6  PLT 475*   BMET Recent Labs    06/25/23 1425  NA 134*  K 3.3*  CL 100  CO2 24  GLUCOSE 82  BUN 7  CREATININE 0.94  CALCIUM 9.0   PT/INR No results for input(s): "LABPROT", "INR" in the last 72 hours. ABG No results for input(s): "PHART", "HCO3" in the last 72 hours.  Invalid input(s): "PCO2", "PO2"  Studies/Results: CT ABDOMEN PELVIS W CONTRAST Result Date: 06/25/2023 CLINICAL DATA:  Abdominal pain, acute, nonlocalized EXAM: CT ABDOMEN AND PELVIS WITH CONTRAST TECHNIQUE: Multidetector CT imaging of the abdomen and pelvis was performed using the standard protocol following bolus administration of intravenous contrast. RADIATION DOSE REDUCTION: This exam was performed according to the departmental dose-optimization program which includes automated exposure control, adjustment of the mA and/or kV according to patient size and/or use of iterative reconstruction technique. CONTRAST:  OMNIPAQUE  IOHEXOL  300 MG/ML  SOLN COMPARISON:  Right upper quadrant ultrasound from earlier the same day. FINDINGS: Lower chest: There are dependent changes in the visualized lung bases. No  overt consolidation. No pleural effusion. The heart is normal in size. No pericardial effusion. Hepatobiliary: The liver is normal in size. Non-cirrhotic configuration. No suspicious mass. There is a 5 mm hypoattenuating focus in the right hepatic lobe, segment 6, which is too small to adequately characterize. No intrahepatic or extrahepatic bile duct dilation. No calcified gallstones. Normal gallbladder wall thickness. No pericholecystic inflammatory changes. Pancreas: Unremarkable. No pancreatic ductal dilatation or surrounding inflammatory changes. Spleen: Within normal limits. No focal lesion. Adrenals/Urinary Tract: Adrenal glands are unremarkable. No suspicious renal mass. There is excreted contrast in bilateral renal collecting systems, which limits the evaluation for nonobstructing calculi. No obstructing calculi or hydroureteronephrosis noted. Unremarkable urinary bladder. Stomach/Bowel: No disproportionate dilation of the small or large bowel loops. The base of the appendix is nondilated and appears within normal limits. However, the tip is dilated measuring up to 8 mm and exhibit moderate periappendiceal fat stranding. There is a 5 mm appendicoliths in the tip. In appropriate clinical settings, findings favor tip appendicitis. No associated walled-off abscess or loculated collection. There is small amount of free fluid in the right paracolic gutter and dependent pelvis, which is favored benign/reactive. No pneumoperitoneum. Vascular/Lymphatic: No abdominal or pelvic lymphadenopathy, by size criteria. No aneurysmal dilation of the major abdominal arteries. Reproductive: The uterus is surgically absent. No large adnexal mass. Other: There is a tiny fat containing umbilical hernia. The soft tissues and abdominal wall are otherwise unremarkable. Musculoskeletal: No suspicious osseous lesions. IMPRESSION: 1. Findings favor tip appendicitis, as described above. No associated walled-off abscess or loculated  collection. 2. Multiple other nonacute observations, as described above. Electronically Signed  By: Beula Brunswick M.D.   On: 06/25/2023 18:29   US  Abdomen Limited Result Date: 06/25/2023 CLINICAL DATA:  Right upper quadrant pain EXAM: ULTRASOUND ABDOMEN LIMITED RIGHT UPPER QUADRANT COMPARISON:  10/19/2015 ultrasound FINDINGS: Gallbladder: No gallstones or wall thickening visualized. No sonographic Murphy sign noted by sonographer. Common bile duct: Diameter: 3 mm Liver: No focal lesion identified. Within normal limits in parenchymal echogenicity. Portal vein is patent on color Doppler imaging with normal direction of blood flow towards the liver. Other: None. IMPRESSION: No gallstones or ductal dilatation. Electronically Signed   By: Adrianna Horde M.D.   On: 06/25/2023 13:47    Anti-infectives: Anti-infectives (From admission, onward)    Start     Dose/Rate Route Frequency Ordered Stop   06/26/23 1930  cefTRIAXone  (ROCEPHIN ) 2 g in sodium chloride  0.9 % 100 mL IVPB  Status:  Discontinued       Placed in "And" Linked Group   2 g 200 mL/hr over 30 Minutes Intravenous Every 24 hours 06/25/23 1923 06/26/23 1018   06/26/23 0730  metroNIDAZOLE  (FLAGYL ) IVPB 500 mg  Status:  Discontinued       Placed in "And" Linked Group   500 mg 100 mL/hr over 60 Minutes Intravenous Every 12 hours 06/25/23 1923 06/26/23 1018   06/26/23 0715  cefoTEtan (CEFOTAN) 2 g in sodium chloride  0.9 % 100 mL IVPB        2 g 200 mL/hr over 30 Minutes Intravenous On call to O.R. 06/26/23 0616 06/26/23 0743   06/25/23 1900  cefTRIAXone  (ROCEPHIN ) 2 g in sodium chloride  0.9 % 100 mL IVPB  Status:  Discontinued       Placed in "And" Linked Group   2 g 200 mL/hr over 30 Minutes Intravenous  Once 06/25/23 1851 06/25/23 1946   06/25/23 1900  metroNIDAZOLE  (FLAGYL ) IVPB 500 mg  Status:  Discontinued       Placed in "And" Linked Group   500 mg 100 mL/hr over 60 Minutes Intravenous  Once 06/25/23 1851 06/25/23 1923        Assessment/Plan: s/p Procedure(s): APPENDECTOMY, LAPAROSCOPIC (N/A) Advance diet Discharge  LOS: 0 days    Lillette Reid III 06/27/2023

## 2023-06-27 NOTE — Progress Notes (Signed)
 Assessment unchanged. Pt and daughter verbalized understanding of dc instructions including medications, follow up care and when to call the doctor. Discharged via wc to front entrance accompanied by NT and daughter.

## 2023-06-27 NOTE — TOC Initial Note (Signed)
 Transition of Care [facility]) - Initial/Assessment Note    Patient Details  Name: Valerie [name] MRN: [id number] Date of Birth: 1975-06-23  [facility] CM/SW Contact:    Levie Ream, RN Phone Number: 06/27/2023, 1:25 PM  Clinical Narrative:                 Eldora Greet w/ pt over phone; she identified POC dtr Valerie [name] [phone]); pt says she lives at home; she plans to return at d/c; pt says her dtr will provide transportation; she denied SDOH risks; pt says she does not have DME, HH services or home oxygen; no TOC needs.  Expected Discharge Plan: Home/Self Care Barriers to Discharge: No Barriers Identified   Patient Goals and CMS Choice Patient states their goals for this hospitalization and ongoing recovery are:: home   Choice offered to / list presented to : NA      Expected Discharge Plan and Services   Discharge Planning Services: CM Consult Post Acute Care Choice: NA Living arrangements for the past 2 months: Single Family Home Expected Discharge Date: 06/27/23               DME Arranged: N/A DME Agency: NA       HH Arranged: NA HH Agency: NA        Prior Living Arrangements/Services Living arrangements for the past 2 months: Single Family Home Lives with:: Self Patient language and need for interpreter reviewed:: Yes Do you feel safe going back to the place where you live?: Yes      Need for Family Participation in Patient Care: Yes (Comment) Care giver support system in place?: Yes (comment) Current home services:  (n/a) Criminal Activity/Legal Involvement Pertinent to Current Situation/Hospitalization: No - Comment as needed  Activities of Daily Living   ADL Screening (condition at time of admission) Independently performs ADLs?: Yes (appropriate for developmental age) Is the patient deaf or have difficulty hearing?: No Does the patient have difficulty seeing, even when wearing glasses/contacts?: No Does the patient  have difficulty concentrating, remembering, or making decisions?: No  Permission Sought/Granted Permission sought to share information with : Case Manager Permission granted to share information with : Yes, Verbal Permission Granted  Share Information with NAME: Case Manager     Permission granted to share info w Relationship: Valerie [name] (dtr) [phone]     Emotional Assessment Appearance:: Other (Comment Required (unable to assess) Attitude/Demeanor/Rapport: Gracious Affect (typically observed): Unable to Assess Orientation: : Oriented to Self, Oriented to Place, Oriented to  Time, Oriented to Situation Alcohol / Substance Use: Not Applicable Psych Involvement: No (comment)  Admission diagnosis:  Acute phlegmonous appendicitis [K35.890] Acute appendicitis with localized peritonitis, without perforation, abscess, or gangrene [K35.30] Patient Active Problem List   Diagnosis Date Noted   Acute appendicitis 06/25/2023   Generalized anxiety disorder 06/25/2023   Insomnia 06/25/2023   Attention deficit hyperactivity disorder (ADHD) 06/25/2023   History of major depression 06/25/2023   Acute phlegmonous appendicitis 06/25/2023   Upper respiratory tract infection 06/22/2020   Moderate persistent asthma with acute exacerbation 06/22/2020   Other atopic dermatitis 08/24/2017   Other adverse food reactions, not elsewhere classified, subsequent encounter 08/24/2017   Contact dermatitis due to chemicals 08/24/2017   Atopic dermatitis in adult 08/24/2017   Other allergic rhinitis 01/11/2014   Endometriosis 01/11/2014   Gastro-esophageal reflux disease without esophagitis 01/11/2014   Low back pain 01/11/2014   Excess, menstruation 01/11/2014   Asthma, mild intermittent 01/11/2014   Rheumatoid  arteritis (HCC) 01/11/2014   PCP:  Algernon Ing, PA-C Pharmacy:   [id number] - [place], [place] - [address] [place] [address] Phone: [phone] Fax: [phone]     Social Drivers of Health (SDOH) Social History: SDOH Screenings   Food Insecurity: No Food Insecurity (06/27/2023)  Housing: Low Risk  (06/27/2023)  Transportation Needs: No Transportation Needs (06/27/2023)  Utilities: Not At Risk (06/27/2023)  Social Connections: Unknown (06/25/2023)  Tobacco Use: Low Risk  (06/26/2023)   SDOH Interventions: Food Insecurity Interventions: Intervention Not Indicated, Inpatient TOC Housing Interventions: Intervention Not Indicated, Inpatient TOC Transportation Interventions: Intervention Not Indicated, Inpatient TOC Utilities Interventions: Intervention Not Indicated, Inpatient TOC   Readmission Risk Interventions     No data to display

## 2023-06-27 NOTE — Plan of Care (Signed)

## 2023-06-29 LAB — SURGICAL PATHOLOGY

## 2023-07-03 NOTE — Discharge Summary (Signed)
 Patient ID: Valerie [name] [id number] [birth year] 48 y.o.  Admit date: 06/25/2023 Discharge date: 06/27/23  Discharge Diagnosis S/p diagnostic laparoscopy, laparoscopic appendectomy on 06/25/23 for acute appendicitis   Consultants None  HPI: 48 year old female.  History of some dermatitis asthma and lactose intolerance and food allergies.  GERD.  Intermittent diarrhea.  Followed by gastroenterology in [facility].  Some history of ADHD depression and anxiety followed by behavioral health.  Relatively stable.  Distant history of some endometriosis.  History of C-section hysterectomy.  No other abdominal surgery.  No sick contacts or travel history.  No severe vomiting but decreased appetite.  No UTIs.  Noticed abdominal pain after dinner yesterday and last night.  Tried some Pepto-Bismol's along with Motrin and Tylenol  without relief.  Eventually went to ER.  History physical and CAT scans concerning for appendicitis.  Surgical consultation requested.    Procedures Dr. Ramiro Burly - 06/26/23  - Diagnostic laparoscopy - Laparoscopic appendectomy  Hospital Course:  Patient admitted for above and underwent laparoscopic appendectomy. On POD 1 she was felt stable for d/c home. Please see progress notes for more detail.   I was not directly involved in this patient's care and did not see the patient during their hospital stay, therefore the information in this discharge summary was taken entirely from the chart.   Allergies as of 06/27/2023       Reactions   Gabapentin Nausea And Vomiting   Other Nausea And Vomiting   Was caused by some type of anesthesia   Lactose Intolerance (gi)    Stomach pain and diarrhea   Lidocaine Nausea And Vomiting   Molds & Smuts Other (See Comments)   Flares asthma   Oxycodone Itching   OK with benadryl         Medication List     TAKE these medications    albuterol  (2.5 MG/3ML) 0.083% nebulizer solution Commonly known as: PROVENTIL  Take 3 mLs (2.5 mg  total) by nebulization every 6 (six) hours as needed for wheezing or shortness of breath.   albuterol  108 (90 Base) MCG/ACT inhaler Commonly known as: VENTOLIN  HFA INHALE 1 TO 2 PUFFS BY MOUTH EVERY 6 HOURS AS NEEDED FOR WHEEZING FOR SHORTNESS OF BREATH   amoxicillin-clavulanate 875-125 MG tablet Commonly known as: AUGMENTIN Take 1 tablet by mouth 2 (two) times daily.   amphetamine -dextroamphetamine  20 MG tablet Commonly known as: ADDERALL Take 20 mg by mouth 2 (two) times daily.   Asmanex  HFA 100 MCG/ACT Aero Generic drug: Mometasone  Furoate Inhale 2 puffs into the lungs in the morning and at bedtime.   buPROPion  150 MG 24 hr tablet Commonly known as: WELLBUTRIN  XL Take 150 mg by mouth daily.   clonazePAM  1 MG tablet Commonly known as: KLONOPIN  Take 1 mg by mouth 2 (two) times daily.   Crisaborole  2 % Oint Commonly known as: Eucrisa  Apply 1 application topically 2 (two) times daily.   diphenhydrAMINE  25 MG tablet Commonly known as: BENADRYL  Take 50 mg by mouth daily as needed for itching (when she takes opioids).   EPINEPHrine  0.3 mg/0.3 mL Soaj injection Commonly known as: Auvi-Q  Use as directed for severe allergic reaction   HYDROcodone -acetaminophen  5-325 MG tablet Commonly known as: NORCO/VICODIN Take 1-2 tablets by mouth every 6 (six) hours as needed for moderate pain (pain score 4-6) or severe pain (pain score 7-10).   levalbuterol  0.63 MG/3ML nebulizer solution Commonly known as: XOPENEX  Take 3 mLs (0.63 mg total) by nebulization every 6 (six) hours as needed  for wheezing or shortness of breath.   levocetirizine 5 MG tablet Commonly known as: XYZAL  Take 5 mg by mouth every evening.   pantoprazole  40 MG tablet Commonly known as: PROTONIX  Take 1 tablet by mouth  daily Need appointment for  refills   prazosin  2 MG capsule Commonly known as: MINIPRESS  Take 2 mg by mouth at bedtime.   promethazine 25 MG tablet Commonly known as: PHENERGAN Take 25 mg by  mouth daily as needed for nausea.   SM Multiple Vitamins/Iron Tabs Take by mouth.   triamcinolone  ointment 0.1 % Commonly known as: KENALOG  APPLY OINTMENT TOPICALLY TWICE DAILY TO RED ITCHY AREA BELOW FACE AND NECK          Follow-up Information     Victoria Henshaw, Puja Gosai, PA-C Follow up.   Specialty: General Surgery Why: our office is scheduling you for post-operative follow up in about 4 weeks, call to confirm appointment date/time. Contact information: [address] [place] [address] [phone]                 Signed: Alferd Igo, [facility] 07/03/2023, 3:40 PM Please see Amion for pager number during day hours 7:00am-4:30pm

## 2023-07-22 ENCOUNTER — Other Ambulatory Visit: Payer: Self-pay | Admitting: Internal Medicine

## 2023-09-12 ENCOUNTER — Other Ambulatory Visit: Payer: Self-pay | Admitting: Internal Medicine

## 2023-09-29 ENCOUNTER — Other Ambulatory Visit: Payer: Self-pay | Admitting: Internal Medicine

## 2023-11-08 ENCOUNTER — Other Ambulatory Visit: Payer: Self-pay | Admitting: Medical Genetics

## 2024-01-04 ENCOUNTER — Other Ambulatory Visit
Admission: RE | Admit: 2024-01-04 | Discharge: 2024-01-04 | Disposition: A | Discharge status: Home / Self Care | Source: Ambulatory Visit | Attending: Medical Genetics | Admitting: Medical Genetics

## 2024-01-18 LAB — GENECONNECT MOLECULAR SCREEN: Genetic Analysis Overall Interpretation: NEGATIVE

## 2024-03-02 ENCOUNTER — Other Ambulatory Visit (HOSPITAL_COMMUNITY): Payer: Self-pay | Admitting: Physician Assistant

## 2024-03-02 DIAGNOSIS — M67432 Ganglion, left wrist: Secondary | ICD-10-CM

## 2024-03-09 ENCOUNTER — Ambulatory Visit (HOSPITAL_COMMUNITY)
Admission: RE | Admit: 2024-03-09 | Discharge: 2024-03-09 | Disposition: A | Discharge status: Home / Self Care | Source: Ambulatory Visit | Attending: Physician Assistant | Admitting: Physician Assistant

## 2024-03-09 DIAGNOSIS — M67432 Ganglion, left wrist: Secondary | ICD-10-CM | POA: Diagnosis present
